# Patient Record
Sex: Female | Born: 1955 | Race: White | Hispanic: No | Marital: Married | Smoking: Former smoker
Health system: Southern US, Community
[De-identification: ages and names within clinical notes are randomized; demographics above are authoritative.]

## PROBLEM LIST (undated history)

## (undated) DIAGNOSIS — K219 Gastro-esophageal reflux disease without esophagitis: Secondary | ICD-10-CM

## (undated) DIAGNOSIS — G629 Polyneuropathy, unspecified: Secondary | ICD-10-CM

## (undated) DIAGNOSIS — Z9889 Other specified postprocedural states: Secondary | ICD-10-CM

## (undated) DIAGNOSIS — E119 Type 2 diabetes mellitus without complications: Secondary | ICD-10-CM

## (undated) DIAGNOSIS — M545 Low back pain, unspecified: Secondary | ICD-10-CM

## (undated) DIAGNOSIS — G4730 Sleep apnea, unspecified: Secondary | ICD-10-CM

## (undated) DIAGNOSIS — F419 Anxiety disorder, unspecified: Secondary | ICD-10-CM

## (undated) DIAGNOSIS — R112 Nausea with vomiting, unspecified: Secondary | ICD-10-CM

## (undated) DIAGNOSIS — E785 Hyperlipidemia, unspecified: Secondary | ICD-10-CM

## (undated) DIAGNOSIS — I10 Essential (primary) hypertension: Secondary | ICD-10-CM

## (undated) DIAGNOSIS — Z794 Long term (current) use of insulin: Secondary | ICD-10-CM

## (undated) DIAGNOSIS — E559 Vitamin D deficiency, unspecified: Secondary | ICD-10-CM

## (undated) DIAGNOSIS — G912 (Idiopathic) normal pressure hydrocephalus: Secondary | ICD-10-CM

## (undated) DIAGNOSIS — R000 Tachycardia, unspecified: Secondary | ICD-10-CM

## (undated) DIAGNOSIS — I2089 Other forms of angina pectoris: Secondary | ICD-10-CM

## (undated) DIAGNOSIS — M75101 Unspecified rotator cuff tear or rupture of right shoulder, not specified as traumatic: Secondary | ICD-10-CM

## (undated) DIAGNOSIS — B009 Herpesviral infection, unspecified: Secondary | ICD-10-CM

## (undated) DIAGNOSIS — R0602 Shortness of breath: Secondary | ICD-10-CM

## (undated) DIAGNOSIS — G919 Hydrocephalus, unspecified: Secondary | ICD-10-CM

## (undated) DIAGNOSIS — F32A Depression, unspecified: Secondary | ICD-10-CM

## (undated) DIAGNOSIS — M519 Unspecified thoracic, thoracolumbar and lumbosacral intervertebral disc disorder: Secondary | ICD-10-CM

## (undated) DIAGNOSIS — Z319 Encounter for procreative management, unspecified: Secondary | ICD-10-CM

## (undated) DIAGNOSIS — D519 Vitamin B12 deficiency anemia, unspecified: Secondary | ICD-10-CM

## (undated) DIAGNOSIS — F329 Major depressive disorder, single episode, unspecified: Secondary | ICD-10-CM

## (undated) DIAGNOSIS — R42 Dizziness and giddiness: Secondary | ICD-10-CM

## (undated) DIAGNOSIS — T753XXA Motion sickness, initial encounter: Secondary | ICD-10-CM

## (undated) DIAGNOSIS — G5793 Unspecified mononeuropathy of bilateral lower limbs: Secondary | ICD-10-CM

## (undated) HISTORY — PX: EYE SURGERY: SHX253

## (undated) HISTORY — PX: HERNIA REPAIR: SHX51

## (undated) HISTORY — PX: BREAST FIBROADENOMA SURGERY: SHX580

## (undated) HISTORY — PX: CHOLECYSTECTOMY: SHX55

## (undated) HISTORY — PX: TUBAL LIGATION: SHX77

## (undated) HISTORY — DX: Nausea with vomiting, unspecified: R11.2

## (undated) HISTORY — DX: Other specified postprocedural states: Z98.890

## (undated) HISTORY — DX: Type 2 diabetes mellitus without complications: E11.9

## (undated) HISTORY — DX: Low back pain, unspecified: M54.50

## (undated) HISTORY — DX: Polyneuropathy, unspecified: G62.9

## (undated) HISTORY — DX: Gastro-esophageal reflux disease without esophagitis: K21.9

---

## 1994-11-24 ENCOUNTER — Ambulatory Visit
Admit: 1994-11-24 | Disposition: A | Discharge status: Home / Self Care | Payer: Self-pay | Admitting: Obstetrics & Gynecology

## 1995-12-28 ENCOUNTER — Ambulatory Visit
Admit: 1995-12-28 | Disposition: A | Discharge status: Home / Self Care | Payer: Self-pay | Admitting: Obstetrics & Gynecology

## 1998-01-01 ENCOUNTER — Ambulatory Visit: Admit: 1998-01-01 | Disposition: A | Discharge status: Home / Self Care | Payer: Self-pay | Admitting: Family Medicine

## 1998-04-09 ENCOUNTER — Ambulatory Visit
Admit: 1998-04-09 | Disposition: A | Discharge status: Home / Self Care | Payer: Self-pay | Admitting: Obstetrics & Gynecology

## 1999-05-18 ENCOUNTER — Ambulatory Visit
Admit: 1999-05-18 | Disposition: A | Discharge status: Home / Self Care | Payer: Self-pay | Admitting: Obstetrics & Gynecology

## 1999-05-19 ENCOUNTER — Ambulatory Visit
Admit: 1999-05-19 | Disposition: A | Discharge status: Home / Self Care | Payer: Self-pay | Admitting: Obstetrics & Gynecology

## 1999-06-16 ENCOUNTER — Ambulatory Visit
Admit: 1999-06-16 | Disposition: A | Discharge status: Home / Self Care | Payer: Self-pay | Admitting: Obstetrics & Gynecology

## 1999-06-16 ENCOUNTER — Other Ambulatory Visit: Payer: Self-pay

## 2000-08-18 ENCOUNTER — Ambulatory Visit
Admit: 2000-08-18 | Disposition: A | Discharge status: Home / Self Care | Payer: Self-pay | Admitting: Obstetrics & Gynecology

## 2001-08-30 ENCOUNTER — Ambulatory Visit
Admission: RE | Admit: 2001-08-30 | Disposition: A | Discharge status: Home / Self Care | Payer: Self-pay | Source: Ambulatory Visit | Admitting: Obstetrics & Gynecology

## 2001-12-14 ENCOUNTER — Ambulatory Visit
Admission: AD | Admit: 2001-12-14 | Disposition: A | Discharge status: Home / Self Care | Payer: Self-pay | Source: Ambulatory Visit | Admitting: Family Medicine

## 2002-01-01 ENCOUNTER — Ambulatory Visit
Admission: AD | Admit: 2002-01-01 | Disposition: A | Discharge status: Home / Self Care | Payer: Self-pay | Source: Ambulatory Visit | Admitting: Family Medicine

## 2002-02-01 ENCOUNTER — Ambulatory Visit
Admission: AD | Admit: 2002-02-01 | Disposition: A | Discharge status: Home / Self Care | Payer: Self-pay | Source: Ambulatory Visit | Admitting: Family Medicine

## 2002-10-25 ENCOUNTER — Ambulatory Visit
Admission: AD | Admit: 2002-10-25 | Disposition: A | Discharge status: Home / Self Care | Payer: Self-pay | Source: Ambulatory Visit | Admitting: Obstetrics & Gynecology

## 2004-02-27 ENCOUNTER — Ambulatory Visit
Admission: AD | Admit: 2004-02-27 | Disposition: A | Discharge status: Home / Self Care | Payer: Self-pay | Source: Ambulatory Visit | Admitting: Obstetrics & Gynecology

## 2004-09-28 ENCOUNTER — Ambulatory Visit
Admission: AD | Admit: 2004-09-28 | Disposition: A | Discharge status: Home / Self Care | Payer: Self-pay | Source: Ambulatory Visit | Admitting: Family Medicine

## 2005-05-07 ENCOUNTER — Ambulatory Visit
Admission: RE | Admit: 2005-05-07 | Disposition: A | Discharge status: Home / Self Care | Payer: Self-pay | Source: Ambulatory Visit | Admitting: Obstetrics & Gynecology

## 2005-06-01 ENCOUNTER — Ambulatory Visit
Admission: RE | Admit: 2005-06-01 | Disposition: A | Discharge status: Home / Self Care | Payer: Self-pay | Source: Ambulatory Visit | Admitting: Obstetrics & Gynecology

## 2006-02-01 DIAGNOSIS — G919 Hydrocephalus, unspecified: Secondary | ICD-10-CM

## 2006-02-01 HISTORY — PX: BRAIN SURGERY: SHX531

## 2006-02-01 HISTORY — DX: Hydrocephalus, unspecified: G91.9

## 2006-02-01 HISTORY — PX: OTHER SURGICAL HISTORY: SHX169

## 2006-05-09 ENCOUNTER — Ambulatory Visit
Admission: RE | Admit: 2006-05-09 | Disposition: A | Discharge status: Home / Self Care | Payer: Self-pay | Source: Ambulatory Visit | Admitting: Obstetrics & Gynecology

## 2006-07-25 ENCOUNTER — Ambulatory Visit
Admission: RE | Admit: 2006-07-25 | Disposition: A | Discharge status: Home / Self Care | Payer: Self-pay | Source: Ambulatory Visit | Admitting: Family Medicine

## 2006-08-15 ENCOUNTER — Ambulatory Visit
Admission: RE | Admit: 2006-08-15 | Disposition: A | Discharge status: Home / Self Care | Payer: Self-pay | Source: Ambulatory Visit | Admitting: Neurological Surgery

## 2006-08-15 LAB — GLUCOSE CSF: CSF Glucose: 63 mg/dL

## 2006-08-15 LAB — CELL COUNT CSF TUBE #1
CSF RBC Count Tube #1: 8 /mm3 — ABNORMAL HIGH (ref 0–0)
CSF WBC Count Tube #1: 1 /mm3 (ref 0–10)

## 2006-08-15 LAB — PROTEIN, CSF: CSF Protein: 17 mg/dL (ref 15–45)

## 2006-08-25 ENCOUNTER — Ambulatory Visit
Admission: RE | Admit: 2006-08-25 | Disposition: A | Discharge status: Home / Self Care | Payer: Self-pay | Source: Ambulatory Visit | Admitting: Neurological Surgery

## 2006-08-26 ENCOUNTER — Ambulatory Visit
Admission: RE | Admit: 2006-08-26 | Disposition: A | Discharge status: Home / Self Care | Payer: Self-pay | Admitting: Neurological Surgery

## 2006-09-08 ENCOUNTER — Ambulatory Visit
Admission: AD | Admit: 2006-09-08 | Disposition: A | Discharge status: Home / Self Care | Payer: Self-pay | Source: Ambulatory Visit | Admitting: Neurological Surgery

## 2006-10-27 ENCOUNTER — Ambulatory Visit
Admission: AD | Admit: 2006-10-27 | Disposition: A | Discharge status: Home / Self Care | Payer: Self-pay | Source: Ambulatory Visit | Admitting: Neurological Surgery

## 2006-10-27 ENCOUNTER — Ambulatory Visit
Admission: RE | Admit: 2006-10-27 | Disposition: A | Discharge status: Home / Self Care | Payer: Self-pay | Source: Ambulatory Visit | Admitting: Neurological Surgery

## 2006-10-28 ENCOUNTER — Ambulatory Visit
Admission: RE | Admit: 2006-10-28 | Disposition: A | Discharge status: Home / Self Care | Payer: Self-pay | Source: Ambulatory Visit | Admitting: Neurological Surgery

## 2006-10-28 LAB — BASIC METABOLIC PANEL
BUN: 17 mg/dL (ref 8–20)
CO2: 29 mEq/L (ref 21–30)
Calcium: 9.8 mg/dL (ref 8.6–10.2)
Chloride: 104 mEq/L (ref 98–107)
Creatinine: 0.9 mg/dL (ref 0.6–1.5)
Glucose: 101 mg/dL — ABNORMAL HIGH (ref 70–100)
Potassium: 4.1 mEq/L (ref 3.6–5.0)
Sodium: 143 mEq/L (ref 136–146)

## 2006-10-28 LAB — CBC- CERNER
Hematocrit: 39.3 % (ref 37.0–47.0)
Hgb: 13.5 G/DL (ref 12.0–16.0)
MCH: 32.4 PG — ABNORMAL HIGH (ref 28.0–32.0)
MCHC: 34.3 G/DL (ref 32.0–36.0)
MCV: 94.5 FL (ref 80.0–100.0)
MPV: 8 FL (ref 7.4–10.4)
Platelets: 293 /mm3 (ref 140–400)
RBC: 4.16 /mm3 — ABNORMAL LOW (ref 4.20–5.40)
RDW: 13.4 % (ref 11.5–15.0)
WBC: 7 /mm3 (ref 3.5–10.8)

## 2006-10-28 LAB — GFR

## 2006-11-10 ENCOUNTER — Ambulatory Visit
Admission: RE | Admit: 2006-11-10 | Disposition: A | Discharge status: Home / Self Care | Payer: Self-pay | Source: Ambulatory Visit | Admitting: Neurological Surgery

## 2007-01-16 ENCOUNTER — Ambulatory Visit
Admission: RE | Admit: 2007-01-16 | Disposition: A | Discharge status: Home / Self Care | Payer: Self-pay | Source: Ambulatory Visit | Admitting: Neurological Surgery

## 2007-06-08 ENCOUNTER — Ambulatory Visit
Admission: RE | Admit: 2007-06-08 | Disposition: A | Discharge status: Home / Self Care | Payer: Self-pay | Source: Ambulatory Visit | Admitting: Obstetrics & Gynecology

## 2007-06-09 ENCOUNTER — Ambulatory Visit
Admission: RE | Admit: 2007-06-09 | Disposition: A | Discharge status: Home / Self Care | Payer: Self-pay | Source: Ambulatory Visit | Admitting: Obstetrics & Gynecology

## 2007-07-19 ENCOUNTER — Ambulatory Visit
Admission: RE | Admit: 2007-07-19 | Disposition: A | Discharge status: Home / Self Care | Payer: Self-pay | Source: Ambulatory Visit | Admitting: Neurological Surgery

## 2007-11-17 ENCOUNTER — Ambulatory Visit
Admission: RE | Admit: 2007-11-17 | Disposition: A | Discharge status: Home / Self Care | Payer: Self-pay | Source: Ambulatory Visit | Admitting: Family Medicine

## 2008-08-21 ENCOUNTER — Ambulatory Visit
Admission: RE | Admit: 2008-08-21 | Disposition: A | Discharge status: Home / Self Care | Payer: Self-pay | Source: Ambulatory Visit | Admitting: Obstetrics & Gynecology

## 2009-09-15 ENCOUNTER — Ambulatory Visit
Admission: RE | Admit: 2009-09-15 | Disposition: A | Discharge status: Home / Self Care | Payer: Self-pay | Source: Ambulatory Visit | Admitting: Obstetrics & Gynecology

## 2009-09-29 ENCOUNTER — Ambulatory Visit
Admission: RE | Admit: 2009-09-29 | Disposition: A | Discharge status: Home / Self Care | Payer: Self-pay | Source: Ambulatory Visit | Admitting: Obstetrics & Gynecology

## 2010-01-23 ENCOUNTER — Ambulatory Visit
Admission: RE | Admit: 2010-01-23 | Disposition: A | Discharge status: Home / Self Care | Payer: Self-pay | Source: Ambulatory Visit | Admitting: Neurology

## 2010-06-11 ENCOUNTER — Ambulatory Visit
Admission: RE | Admit: 2010-06-11 | Disposition: A | Discharge status: Home / Self Care | Payer: Self-pay | Source: Ambulatory Visit | Attending: Neurology | Admitting: Neurology

## 2010-08-10 ENCOUNTER — Ambulatory Visit
Admission: RE | Admit: 2010-08-10 | Discharge: 2010-08-10 | Disposition: A | Discharge status: Home / Self Care | Payer: Self-pay | Source: Ambulatory Visit | Attending: Neurology | Admitting: Neurology

## 2010-08-15 ENCOUNTER — Ambulatory Visit
Admission: RE | Admit: 2010-08-15 | Disposition: A | Discharge status: Home / Self Care | Payer: Self-pay | Source: Ambulatory Visit | Attending: Neurology | Admitting: Neurology

## 2010-09-15 ENCOUNTER — Ambulatory Visit
Admission: RE | Admit: 2010-09-15 | Disposition: A | Discharge status: Home / Self Care | Payer: Self-pay | Source: Ambulatory Visit | Attending: Obstetrics & Gynecology | Admitting: Obstetrics & Gynecology

## 2010-09-22 ENCOUNTER — Ambulatory Visit
Admission: RE | Admit: 2010-09-22 | Disposition: A | Discharge status: Home / Self Care | Payer: Self-pay | Source: Ambulatory Visit | Attending: Neurology | Admitting: Neurology

## 2010-10-03 ENCOUNTER — Ambulatory Visit
Admission: RE | Admit: 2010-10-03 | Disposition: A | Discharge status: Home / Self Care | Payer: Self-pay | Source: Ambulatory Visit | Attending: Neurology | Admitting: Neurology

## 2010-11-02 ENCOUNTER — Ambulatory Visit
Admission: RE | Admit: 2010-11-02 | Disposition: A | Discharge status: Home / Self Care | Payer: Self-pay | Source: Ambulatory Visit | Attending: Neurology | Admitting: Neurology

## 2010-11-04 ENCOUNTER — Ambulatory Visit
Admission: RE | Admit: 2010-11-04 | Disposition: A | Discharge status: Home / Self Care | Payer: Self-pay | Source: Ambulatory Visit | Attending: Family Medicine | Admitting: Family Medicine

## 2010-11-11 LAB — ECG 12-LEAD
Atrial Rate: 60 {beats}/min
P Axis: 29 degrees
P-R Interval: 198 ms
Q-T Interval: 424 ms
QRS Duration: 98 ms
QTC Calculation (Bezet): 424 ms
R Axis: -14 degrees
T Axis: -7 degrees
Ventricular Rate: 60 {beats}/min

## 2010-11-19 NOTE — Consults (Signed)
DATE OF BIRTH:                        1955/02/13      ADMISSION DATE:                     10/28/2006            PATIENT LOCATION:                    [id number]            DATE OF CONSULTATION:                10/28/2006      CONSULTANT:                        Lendell Caprice, MD      CONSULTING SERVICE:                  NEUROSURGERY            Ms. Olivarez is about two months out from a revision of a distal VP shunt.      She called me yesterday to inform me that over the past two days she has      noticed slight firmness over the abdominal incision site and she presented      to the office.  She had no guarding or abdominal tenderness and no      drainage, but indeed there was some firmness deep to the incision which was      otherwise well healed.  I sent her for an abdominal x-ray which revealed      coiling of the abdomen and I had her return back for a CT scan which has      revealed the catheter to be dislodged deep to the subcutaneous region.  We      discussed the option of another shunt revision without any guarantees that      this would not occur again vs removal of the shunt in total but she states      that the shunt has helped her a lot, especially with regards to her balance      so she wants to go ahead with a revision again.  The last operation was      assisted with General Surgery- Dr. Estil Daft, therefore I contacted him again      and he will assist with the revision of the distal shunt catheter again and      his plan is to proceed with the placement in another location altogether      and this was reviewed with the patient who was in agreement.  Today I      rediscussed the risks, benefits and alternatives of surgery in detail with      her and her husband again.  I emphasized that there would be no guarantees      that she would not have another dislodgement as even this one is unusual      that she is about two months out from her last revision.  She is a rather      obese female and she is aware of  these potential issues and has requested      to proceed.  We discussed the risks of the surgery including but not      limited to infection, bleeding, bowel perforation, worsening or  no change      in symptoms, shunt failure, coma and death. Her questions were answered and      I discussed these again in the preop area with her and her husband, and      they have requested to proceed with surgery.  I will assist Dr. Estil Daft with      the operation as needed.  No guarantees with regards to her outcome was      given or implied.                        Electronic Signing MD: Lendell Caprice, MD  ([id number])            D: 10/28/2006 by Lendell Caprice, MD      T: 10/28/2006 by Dot Been ([id number]) (Dorris Carnes: [id number])      cc:  Lendell Caprice, MD          Particia Lather, MD

## 2010-11-19 NOTE — Op Note (Signed)
DATE OF BIRTH:                        Oct 02, 1955      ADMISSION DATE:                     08/26/2006            PATIENT LOCATION:                     [id number]            DATE OF PROCEDURE:                   08/26/2006      SURGEON:                            Lendell Caprice, MD      ASSISTANT(S):                  COSURGEON:  Particia Lather, M.D.            PREOPERATIVE DIAGNOSIS:  DISTAL RIGHT VENTRICULOPERITONEAL SHUNT FAILURE.            POSTOPERATIVE DIAGNOSIS:  DISTAL RIGHT VENTRICULOPERITONEAL SHUNT FAILURE.            PROCEDURE:  REVISION OF DISTAL VENTRICULOPERITONEAL SHUNT.            ANESTHESIA:  Local with IV sedation.            INDICATIONS FOR SURGERY:  This is a patient with hydrocephalus who is      rather obese. She underwent a recent VP shunt.  About 7 days after the      surgery, she noticed some swelling over her incision, and she underwent a      CT scan, revealing the catheter to be dislodged in the subcutaneous region.      Given this finding, we discussed the risks, benefits, and alternatives to      the above-mentioned operation, and informed consent was obtained.  I did      inform her that there would be no guarantees that she would not have      another bout of catheter dislodgement given her obesity and that should      this occur we might have to revise the catheter again.  Her questions were      answered, and she is in agreement with the plan.  She was also informed      that this would be a revision performed with me and Dr. Estil Daft in general      surgery.            DESCRIPTION OF PROCEDURE:  After obtaining informed consent, the patient      was brought to the operating room, where she was induced with IV sedation,      and the abdomen was prepped and draped in the usual sterile fashion.  All      pressure points were meticulously padded.  Local anesthetics were      infiltrated, and the old abdominal incision was reopened on the right side.      There was a large CSF fluid collection  which was irrigated and suctioned      out.            At this point, the peritoneal catheter was identified, and Dr. Estil Daft and  I      reidentified the fascial closure and the peritoneal opening and reopened      the peritoneum.  The bowel and omentum were directly visualized.  We      elected to extend the length of the catheter to yet a longer piece given      her fairly large body habitus.  Therefore, a straight connector was used to      connect the 2 ends of the peritoneal catheter, which was secured with 2-0      silk ties at each end, and the Bactiseal peritoneal catheter was extended      and placed into the intraperitoneal cavity under direct visualization.  The      peritoneum was reapproximated with multiple 2-0 Vicryls, and the fascial      layer was then closed with multiple 0 Vicryls after copious irrigation with      bacitracin solution.            The wound and the placement of the catheter were performed by Dr. Estil Daft      and myself, and the subcutaneous layer was then reapproximated additionally      with Vicryl sutures and the skin was stapled.  A sterile dressing was      applied.  The patient was awakened and taken to the recovery room in stable      condition.  All needle and sponge counts were deemed correct by the      circulating nursing staff.            ESTIMATED BLOOD LOSS:   Less than 25 mL.            The patient did not require any intraoperative blood transfusion.                                          Electronic Signing MD: Lendell Caprice, MD  ([id number])            D: 08/26/2006 by Lendell Caprice, MD      T: 08/26/2006 by [id number] ([id number]) ([id number])      cc:  Lendell Caprice, MD          Particia Lather, MD

## 2010-11-19 NOTE — Op Note (Signed)
DATE OF BIRTH:                        January 25, 1956      ADMISSION DATE:                     08/26/2006            PATIENT LOCATION:                     [id number]            DATE OF PROCEDURE:                   08/26/2006      SURGEON:                            Particia Lather, MD      ASSISTANT(S):                  COSURGEON:  Lendell Caprice, M.D.            PREOPERATIVE DIAGNOSIS:  DISTAL RIGHT VENTRICULAR SHUNT FAILURE.            POSTOPERATIVE DIAGNOSIS:  DISTAL RIGHT VENTRICULAR SHUNT FAILURE.            PROCEDURE:  REVISION OF DISTAL VENTRICULOPERITONEAL SHUNT.            ANESTHESIA:  Local with intravenous sedation.            INDICATIONS:  The patient recently underwent a right ventriculoperitoneal      shunt placement for hydrocephalus by Dr. Virgel Manifold on 08/15/2006.  Dr.      Virgel Manifold had tunneled the catheter subsequently in the right anterior chest      and across the right subcostal margin to a periumbilical incision.  Seven      days after the procedure the patient noticed a swelling under her      periumbilical incision.  A CT scan of the abdomen showed that the catheter      was dislodged in the subcutaneous region.  The patient is being brought      back to the operating room for revision of the distal portion of the      catheter.            DESCRIPTION OF PROCEDURE:  The chest and abdomen were prepped and draped in      usual sterile fashion.  We started by reopening the periumbilical incision      and the cerebrospinal fluid was drained; there were no signs of infection.      The catheter was identified subcutaneously.  The fascial closure was      reopened.  The catheter was retrieved.  In order to avoid a subsequent      dislodgement of the catheter, we decided to extend the length of the      catheter by using a metal connector with an additional catheter length.      The connector was secured with 2 silk sutures.  The extended catheter was      then reinserted back into the peritoneal cavity and  directed towards the      right lower quadrant.  The fascia was closed with interrupted 0 Vicryl      sutures. An additional suture was used to anchor the metal connector to the  fascia to avoid dislodgement.  the subcutaneous tissue was approximated      with multiple interrupted sutures of 2-0 Vicryl and the skin was closed      with staples.  The patient tolerated the procedure well.  The patient left      the operating room to the recovery room in a satisfactory condition.                                          Electronic Signing MD: Particia Lather, MD  ([id number])            D: 10/28/2006 by Particia Lather, MD      T: 10/28/2006 by [id number] ([id number]) ([id number])      cc:  Lendell Caprice, MD          Particia Lather, MD

## 2010-11-19 NOTE — Op Note (Signed)
DATE OF BIRTH:                        September 29, 1955      ADMISSION DATE:                     08/15/2006            PATIENT LOCATION:                    [id number]            DATE OF PROCEDURE:                   08/15/2006      SURGEON:                            Lendell Caprice, MD      ASSISTANT(S):                         Bernadene Bell Hegens, SA-C                  PREOPERATIVE DIAGNOSIS:  HYDROCEPHALUS.            POSTOPERATIVE DIAGNOSIS:  HYDROCEPHALUS.            PROCEDURES      1.   PLACEMENT OF RIGHT VENTRICULOPERITONEAL SHUNT USING MEDTRONIC DELTA      1.5 MEDIUM PRESSURE VALVE.      2.   NEUROPEN-ASSISTED ENDOSCOPIC PLACEMENT OF VENTRICULAR CATHETER.            ANESTHESIA:  General endotracheal.            INDICATION FOR SURGERY:  This is a 55 year old patient with      ventriculomegaly and difficulties with gait and headaches.  Given her      clinical and radiographic findings, we discussed the risks, benefits and      alternatives to the above mentioned operation including, but not limited to      infection, bleeding, neurological injury, bowel perforation, shunt failure      requiring revision, worsening or no change in symptoms, coma and death.      After consideration and discussion with her husband, she has requested to      proceed with the surgery.  No guarantees with regards to her outcome were      given or implied.            DESCRIPTION OF PROCEDURE:  After obtaining informed consent, the patient      was brought to the operating room, where after successful induction and      intubation by anesthesia, IV antibiotics were administered.  The lower      extremities were placed in sequentials and all pressure points were      meticulously padded.  The head on the right side was shaved and the head,      neck, chest and abdomen were prepped and draped in the usual sterile      fashion.  Local anesthetics were infiltrated and a right frontal incision      was made as well as a right periumbilical incision.  A  BactiSeal Codman      catheter was tunneled and an intervening stab incision was made in the      right cervical area.  The Delta 1.5 medium pressure  valve was then primed      and it was ensured to be working properly and there was good distal flow      and secured to the BactiSeal catheter with a 2-0 silk tie.  The apparatus      was then tunneled.  One bur hole was then placed in the right frontal area      and using the Neuropen neural endoscopic device, the right ventricular      catheter was cannulated without any difficulty and the foramen of Monroe      was identified.  The CSF was noted to be under moderate pressure and it was      sent for routine pathological examination and the catheter was placed in      proximity to the foramen of Monroe and secured to the proximal portion of      the valve with a 2-0 silk tie and the valve was tunneled deep to the      incision.  All wounds were then irrigated with copious bacitracin solution      and there was good distal flow of CSF as noted through the shunt by pumping      the valve itself.  At this time the peritoneum was identified and      intraperitoneal contents were directly visualized by performing Valsalva      maneuver and the catheter was placed into the intraperitoneal cavity      without any difficulty and the peritoneum and the overlying fascial layers      were then reapproximated with multiple 2-0 Vicryls and all incisions were      irrigated with copious bacitracin solution.  Hemostasis was achieved and      the incisions were reapproximated in multiple anatomical layers and stapled      along the skin.  Sterile dressings were applied.            The patient was awakened, extubated and taken to the recovery room in      stable condition.  All needle and sponge counts were deemed correct by the      circulating nursing staff.  Estimated blood loss was less than 25-mL.  The      patient did not require any intraoperative blood transfusion.                                     ___________________________________          Date Signed: __________      Lendell Caprice, MD  ([id number])            D: 08/15/2006 by Lendell Caprice, MD      T: 08/15/2006 by [id number] ([id number]) ([id number])      cc:  Lendell Caprice, MD            Dr. Burnell Blanks, [address], [place], [place] [address]

## 2010-11-19 NOTE — Op Note (Signed)
DATE OF BIRTH:                        [birth year]      ADMISSION DATE:                     10/28/2006            PATIENT LOCATION:                    [facility] 10/28/2006            DATE OF PROCEDURE:                   10/28/2006      SURGEON:                            Particia Lather, MD      ASSISTANT(S):                         Lendell Caprice, MD                  PREOPERATIVE DIAGNOSIS:  DISTAL VENTRICULOPERITONEAL SHUNT FAILURE.            POSTOPERATIVE DIAGNOSIS:  DISTAL VENTRICULOPERITONEAL SHUNT FAILURE.            PROCEDURE:  REVISION OF DISTAL VENTRICULOPERITONEAL SHUNT.            ANESTHESIA:      1.   General with LMA.      2.   Local with 1% Xylocaine.            INDICATIONS:  The patient underwent a right ventriculoperitoneal shunt for      hydrocephalus on 08/15/2006.  The shunt was inserted intraperitoneally      through a midline periumbilical incision.  On 08/26/2006, the distal part      of the shunt was dislodged into the subcutaneous region.  I worked with Dr.      Virgel Manifold on revising the distal part of the shunt with reinsertion of the      catheter through the same periumbilical incision.  The patient did well      until yesterday when she was found to have another dislodgement of the      catheter to the subcutaneous region with a subcutaneous CSF cyst.  The      patient returned to the hospital for distal shunt revision.  I recommended      insertion of the shunt through a subxiphoid incision.            DESCRIPTION OF PROCEDURE:  The fluoroscopy was used to identify the course      of the catheter in the abdomen.  External marks were made on the skin to      mark that course. The catheter was identified in the right upper quadrant      in the mid clavicular line at the level of the right subcostal margin.  The      chest and abdomen were prepped and draped in the usual sterile fashion.  A      horizontal incision was made under the right subcostal margin centered over      the skin mark that was  made preoperatively.  The catheter was easily      identified in the subcutaneous tissue.  An attempt  at retrieving the entire      catheter through the right subcostal incision was unsuccessful, as it      appeared that the catheter was adherent to the periumbilical incision.  For      that reason, the periumbilical wound was reopened.  The subcutaneous CSF      cyst was entered.  The catheter was identified.  The metal connector that      was used in the previous operation was identified.  The catheter was      transected proximal to that connector.  The catheter was then retrieved      through the right subcostal incision.  The distal part of the catheter was      pulled out of the peritoneum easily and discarded.  A new catheter was then      brought into the field and connected to the proximal part of the old      catheter.  A vertical midline subxiphoid incision was made.  The linea alba      was identified and incised.  The extraperitoneal fat was dissected, and the      peritoneum was identified.  The peritoneum was entered, and the left lobe      of the liver was identified.  The catheter was tunneled between the      subcostal and the subxiphoid incisions.  Using a bayonet forceps, the      catheter was inserted intraperitoneally over the left lobe of the liver.      The linea alba was closed with multiple interrupted sutures of 0 PDS.  The      subcutaneous tissue was closed with subcutaneous sutures of 2-0 Vicryl, and      the skin was closed with staples.  The subcostal incision and periumbilical      incision were also closed in a similar fashion.  The patient tolerated the      procedure well.  The estimated blood loss was minimal.  The patient was      extubated in the operating room and taken to the recovery room in      satisfactory condition.                                          Electronic Signing MD: Particia Lather, MD  ([id number])            D: 10/28/2006 by Particia Lather, MD      T: 10/28/2006 by  [id number] ([id number]) ([id number])      cc:  Particia Lather, MD

## 2010-11-19 NOTE — Discharge Summary (Signed)
DATE OF BIRTH:                        May 31, 1955            ADMISSION DATE:                     10/28/2006      DISCHARGE DATE:                     10/28/2006            ATTENDING PHYSICIAN:                  Lendell Caprice, MD            HISTORY OF PRESENT ILLNESS:  This is a patient who underwent a revision of      distal VP shunt by Dr. Estil Daft and myself.  Postoperatively she was      discharged home after recovering from anesthesia.  Supportive measures and      precautions were reviewed with the patient and her husband.            DISCHARGE FOLLOWUP:  She is to follow up with me in a couple of weeks.            DISCHARGE MEDICATIONS:  She was discharged home with analgesics and a      course of oral antibiotics.                                          Electronic Signing MD: Lendell Caprice, MD  ([id number])            D: 10/29/2006 by Lendell Caprice, MD      T: 10/30/2006 by [id number] ([id number]) Dorris Carnes: [id number])      cc:  Lendell Caprice, MD

## 2010-11-19 NOTE — Discharge Summary (Signed)
DATE OF BIRTH:                        [birth year]            ADMISSION DATE:                     08/15/2006      DISCHARGE DATE:                     08/16/2006            ATTENDING PHYSICIAN:                  Lendell Caprice, MD            HISTORY OF PRESENT ILLNESS:  This is a patient with hydrocephalus who      underwent a right VP shunt placement.  Postoperatively she received      antibiotics.  She received postoperative analgesics and was up ambulating,      tolerating p.o. well.  Followup routine head CT scan was performed.  No      intraventricular hemorrhage or significant intracerebral hemorrhage noted.      Clinically she was doing well on postoperative day number 1, and prior to      discharge, her incisions were inspected; they were noted to be healing      well.  She was discharged home on postoperative day number 1.            Supportive measures and precautions were reviewed with her.  She will      follow up with me in a couple of weeks for evaluation of her incisions.      She was discharged home on Vicodin for analgesic use, and she is to      continue her preoperative medications otherwise.                                    Electronic Signing MD: Lendell Caprice, MD  ([id number])            D: 08/16/2006 by Lendell Caprice, MD      T: 08/16/2006 by [id number] ([id number]) (Dorris Carnes: [id number])      cc:  Lendell Caprice, MD            Dr. Burnell Blanks

## 2010-12-03 ENCOUNTER — Ambulatory Visit
Admission: RE | Admit: 2010-12-03 | Disposition: A | Discharge status: Home / Self Care | Payer: Self-pay | Source: Ambulatory Visit | Attending: Neurology | Admitting: Neurology

## 2011-04-20 ENCOUNTER — Ambulatory Visit
Admission: RE | Admit: 2011-04-20 | Disposition: A | Discharge status: Home / Self Care | Payer: Self-pay | Source: Ambulatory Visit | Attending: Family Medicine | Admitting: Family Medicine

## 2011-10-06 ENCOUNTER — Other Ambulatory Visit: Payer: Self-pay | Admitting: Obstetrics & Gynecology

## 2011-10-06 DIAGNOSIS — Z1231 Encounter for screening mammogram for malignant neoplasm of breast: Secondary | ICD-10-CM

## 2011-10-07 ENCOUNTER — Ambulatory Visit
Admission: RE | Admit: 2011-10-07 | Discharge: 2011-10-07 | Disposition: A | Discharge status: Home / Self Care | Payer: BC Managed Care – PPO | Source: Ambulatory Visit | Attending: Obstetrics & Gynecology | Admitting: Obstetrics & Gynecology

## 2011-10-07 DIAGNOSIS — Z1231 Encounter for screening mammogram for malignant neoplasm of breast: Secondary | ICD-10-CM

## 2012-01-21 ENCOUNTER — Emergency Department: Payer: BC Managed Care – PPO

## 2012-01-21 ENCOUNTER — Emergency Department
Admission: EM | Admit: 2012-01-21 | Discharge: 2012-01-21 | Disposition: A | Discharge status: Home / Self Care | Payer: BC Managed Care – PPO | Attending: Emergency Medicine | Admitting: Emergency Medicine

## 2012-01-21 DIAGNOSIS — W010XXA Fall on same level from slipping, tripping and stumbling without subsequent striking against object, initial encounter: Secondary | ICD-10-CM | POA: Insufficient documentation

## 2012-01-21 DIAGNOSIS — E785 Hyperlipidemia, unspecified: Secondary | ICD-10-CM | POA: Insufficient documentation

## 2012-01-21 DIAGNOSIS — S43006A Unspecified dislocation of unspecified shoulder joint, initial encounter: Secondary | ICD-10-CM | POA: Insufficient documentation

## 2012-01-21 DIAGNOSIS — I10 Essential (primary) hypertension: Secondary | ICD-10-CM | POA: Insufficient documentation

## 2012-01-21 DIAGNOSIS — E119 Type 2 diabetes mellitus without complications: Secondary | ICD-10-CM | POA: Insufficient documentation

## 2012-01-21 DIAGNOSIS — G4730 Sleep apnea, unspecified: Secondary | ICD-10-CM | POA: Insufficient documentation

## 2012-01-21 DIAGNOSIS — F411 Generalized anxiety disorder: Secondary | ICD-10-CM | POA: Insufficient documentation

## 2012-01-21 HISTORY — DX: Essential (primary) hypertension: I10

## 2012-01-21 HISTORY — DX: Anxiety disorder, unspecified: F41.9

## 2012-01-21 HISTORY — DX: Sleep apnea, unspecified: G47.30

## 2012-01-21 HISTORY — DX: Hyperlipidemia, unspecified: E78.5

## 2012-01-21 HISTORY — DX: Type 2 diabetes mellitus without complications: E11.9

## 2012-01-21 MED ORDER — PROPOFOL 10 MG/ML IV EMUL
100.00 mg | Freq: Once | INTRAVENOUS | Status: AC
Start: 2012-01-21 — End: 2012-01-21
  Administered 2012-01-21: 50 mg via INTRAVENOUS
  Filled 2012-01-21: qty 1

## 2012-01-21 MED ORDER — SODIUM CHLORIDE 0.9 % IV BOLUS
1000.00 mL | Freq: Once | INTRAVENOUS | Status: AC
Start: 2012-01-21 — End: 2012-01-21
  Administered 2012-01-21: 1000 mL via INTRAVENOUS

## 2012-01-21 MED ORDER — HYDROCODONE-ACETAMINOPHEN 5-300 MG PO TABS
1.00 | ORAL_TABLET | Freq: Four times a day (QID) | ORAL | Status: DC | PRN
Start: 2012-01-21 — End: 2012-10-04

## 2012-01-21 MED ORDER — IBUPROFEN 600 MG PO TABS
600.00 mg | ORAL_TABLET | Freq: Four times a day (QID) | ORAL | Status: AC | PRN
Start: 2012-01-21 — End: 2012-01-31

## 2012-01-21 NOTE — ED Provider Notes (Signed)
Physician/Midlevel provider first contact with patient: 01/21/12 1804         History     Chief Complaint   Patient presents with   . Shoulder Injury     HPI Comments: MICKENZIE STOLAR is a 56 y.o. female slipped and fell ~ 4 pm onto R shoulder and c/o pain to shoulder radiating down arm w/ no other pain or injury.  Seen at urgent care and XR done with ? Dislocation so sent to ED for further eval/care.      Patient is a 56 y.o. female presenting with shoulder injury. The history is provided by the patient and the spouse.   Shoulder Injury  This is a new problem. The current episode started today. The problem occurs constantly. The problem has been unchanged.       Past Medical History   Diagnosis Date   . Hypertensive disorder    . Hyperlipidemia    . Diabetes mellitus without complication    . Hydrocephalus    . Anxiety    . Sleep apnea        Past Surgical History   Procedure Date   . Cecarean        Family History   Problem Relation Age of Onset   . Breast cancer Neg Hx        Social  History   Substance Use Topics   . Smoking status: Never Smoker    . Smokeless tobacco: Not on file   . Alcohol Use: No       .     No Known Allergies    Current/Home Medications    ACYCLOVIR (ZOVIRAX) 400 MG TABLET        HYDROCHLOROTHIAZIDE (HYDRODIURIL) 25 MG TABLET        JANUMET 50-500 MG PER TABLET    daily.     LOVAZA 1 G CAPSULE        LYRICA 100 MG CAPSULE        METOPROLOL XL (TOPROL-XL) 50 MG 24 HR TABLET        PANTOPRAZOLE (PROTONIX) 40 MG TABLET        PAROXETINE (PAXIL) 10 MG TABLET        QUINAPRIL (ACCUPRIL) 40 MG TABLET    20 mg.     WELCHOL 3.75 G PACK            Review of Systems   All other systems reviewed and are negative.        Physical Exam    BP 162/72  Pulse 50  Temp 98.3 F (36.8 C)  Resp 16  Ht 1.549 m  Wt 77.111 kg  BMI 32.14 kg/m2  SpO2 100%    Physical Exam   Nursing note and vitals reviewed.  Constitutional: She is oriented to person, place, and time. She appears well-developed and  well-nourished.   HENT:   Head: Normocephalic and atraumatic.   Eyes: Conjunctivae normal and EOM are normal.   Neck: Normal range of motion. Neck supple.   Cardiovascular: Normal rate and regular rhythm.    Pulmonary/Chest: Effort normal and breath sounds normal.   Abdominal: Soft. There is no tenderness.   Musculoskeletal:        R shoulder w/ deformity and TTP w/ ROM limited by pain.  No numbness (axillary nerve sensation intact) with distal radial pulse and motor intact.     Neurological: She is alert and oriented to person, place, and time.   Skin: Skin is  warm and dry.       MDM and ED Course     ED Medication Orders      Start     Status Ordering Provider    01/21/12 1945   sodium chloride 0.9 % bolus 1,000 mL   Once      Route: Intravenous  Ordered Dose: 1,000 mL         Last MAR action:  Stopped Elianny Buxbaum C    01/21/12 1945   propofol (DIPRIVAN) injection 100 mg   Once      Route: Intravenous  Ordered Dose: 100 mg         Last MAR action:  Given Shanise Balch C                 MDM  Nurses notes including past history, allergies, vital signs reviewed.  Past Medical History   Diagnosis Date   . Hypertensive disorder    . Hyperlipidemia    . Diabetes mellitus without complication    . Hydrocephalus    . Anxiety    . Sleep apnea      Past Surgical History   Procedure Date   . Cecarean      No Known Allergies    BP 162/72  Pulse 50  Temp 98.3 F (36.8 C)  Resp 16  Ht 1.549 m  Wt 77.111 kg  BMI 32.14 kg/m2  SpO2 100%    O2 Sat in ED is 100% which is normal.  Pt is being observed on intermittent pulsox    Fall with shoulder injury.  Outside radiology disc reviewed with 2 view XR R shoulder showing anterior/inferior dislocation without fracture.  Plan to reduce.    Patient LILLYANN AHART was seen and treated by me in the ED  Forest Gleason, MD  6:55 PM          Orthopedic Injury  Date/Time: 01/21/2012 7:53 PM  Performed by: Forest Gleason  Authorized by: Forest Gleason  Consent: Verbal consent  obtained.  Risks and benefits: risks, benefits and alternatives were discussed  Consent given by: patient and spouse  Patient understanding: patient states understanding of the procedure being performed  Patient consent: the patient's understanding of the procedure matches consent given  Procedure consent: procedure consent matches procedure scheduled  Relevant documents: relevant documents present and verified  Test results: test results available and properly labeled  Site marked: the operative site was marked  Imaging studies: imaging studies available  Patient identity confirmed: verbally with patient and provided demographic data  Time out: Immediately prior to procedure a "time out" was called to verify the correct patient, procedure, equipment, support staff and site/side marked as required.  Injury location: shoulder  Location details: right shoulder  Injury type: dislocation  Pre-procedure neurovascular assessment: neurovascularly intact  Patient sedated: yes  Manipulation performed: yes  Reduction method: external rotation  Reduction successful: yes  Immobilization: sling  Post-procedure neurovascular assessment: post-procedure neurovascularly intact  Patient tolerance: Patient tolerated the procedure well with no immediate complications.      SEDATION ASSESSMENT AND REPORT      Date Time: 01/24/2012 10:37 AM  Patient Name: LAQUIDA, COTRELL [id number]     Procedure: Joint Dislocation Reduction: shoulder    Written consent was obtained and signed originals were given to the nursing staff for scanning into the record.  No    History       Past Medical History:  Past Medical History  Diagnosis Date   . Hypertensive disorder    . Hyperlipidemia    . Diabetes mellitus without complication    . Hydrocephalus    . Anxiety    . Sleep apnea        Past Surgical History:  Past Surgical History   Procedure Date   . Cecarean          History of anesthesia complications:  none    Allergies: No Known  Allergies      Difficult airway risk factors     History of sleep apnea No  Obesity Yes  Short or thick neck Yes  Restricted tracheal lumen No  Limited cervical extension No  History of prior difficult intubation No  Other:  V-P shunt in place    NPO: Since 2 pm      Pre sedation Physical Exam   Immediately prior to sedation the patient was reassessed.  Blood pressure 162/72, pulse 50, temperature 98.3 F (36.8 C), resp. rate 16, height 1.549 m, weight 77.111 kg, SpO2 100.00%.    Please see history and physical from ED note on this date of service for complete details of initial presentation.    My immediate pre-sedation assessment includes the following changes from my initial assessment:  None          Mallampati Score: Class 3: Soft and hard palate and base of the uvula are visible    ASA Class:Class 2 - Mild systemic disease, no functional limitations    Deep sedation using moderate sedation guidelines single provider caveat:  In this case, there was only one Medical Staff Member available to perform the procedure and the sedation.  After careful consideration, the benefit of proceeding with one provider outweighs the risk of delaying the procedure while awaiting availability of a second provider.  Yes    Immediately prior to the procedure the necessary equipment for sedation was verified to be present, a time out was called and the patients name and planned procedure were reviewed at the bedside.    Plan       Plan: Propofol      Post Procedure Recovery     Complications: none     I was continuously present at the bedside for 16-38 minutes following the administration of the first dose of sedation medication.    Patient was reassessed following the procedure and reached their pre-procedure baseline.  Yes       Forest Gleason, MD      Radiology Results (24 Hour)     Procedure Component Value Units Date/Time    Shoulder Right 2+ Views [[id number]] Collected:01/21/12 2010    Order Status:Completed   Updated:01/21/12 2018    Narrative:    History: Postreduction     AP and scapular Y views of the right shoulder demonstrate no evidence of  fracture, dislocation, or significant bone lesion.  Joint spaces are  normal.  No soft tissue calcification is evident.       Impression:     Normal study.                         Clinical Impression & Disposition     Clinical Impression  Final diagnoses:   Shoulder dislocation        ED Disposition     Discharge Feven A Brand[facility] discharge to home/self care.    Condition at discharge: Stable  New Prescriptions    HYDROCODONE-ACETAMINOPHEN (VICODIN) 5-300 MG TABS    Take 1-2 tablets by mouth every 6 (six) hours as needed.    IBUPROFEN (ADVIL,MOTRIN) 600 MG TABLET    Take 1 tablet (600 mg total) by mouth every 6 (six) hours as needed for Pain or Fever.               Forest Gleason, MD  01/24/12 1037

## 2012-01-21 NOTE — Discharge Instructions (Signed)
Dislocation, Shoulder    You have been seen for a dislocation of your shoulder.    The dislocation has been reduced (repaired) so that the bones are now in their normal position.    A dislocation occurs when one of the 2 bones which make up a joint gets out of place so it is no longer in the proper alignment to allow the joint to bend. Sometimes a ligament or set of ligaments becomes injured during the dislocation. Shoulder dislocations can also cause an injury to one of the nerves which supplies the arm and hand with sensation. A fracture (broken bone) may also be associated with a dislocation.    The general care of a dislocation after relocation of the bones, is the use of a medication to reduce pain, the use of a sling to reduce movement, and Resting, Icing, Compressing and Elevating the injured area. Remember this as "RICE."   REST: Limit the use of the injured body part.   ICE: By applying ice to the affected area, swelling and pain can be reduced. Place some ice cubes in a re-sealable (Ziploc) bag and add some water. Put a thin washcloth between the bag and the skin. Apply the ice bag to the area for at least 20 minutes. Do this at least 4 times per day. Using the ice for longer times and more frequently is OK. NEVER APPLY ICE DIRECTLY TO THE SKIN.   COMPRESS: Compression means to apply pressure around the injured area such as with a splint, cast or an ace bandage. Compression decreases swelling and improves comfort. Compression should be tight enough to relieve swelling but not so tight as to decrease circulation. Increasing pain, numbness, tingling, or change in skin color, are all signs of decreased circulation.   ELEVATE: Elevate the injured part. For example, an arm can be elevated by using a sling while standing and sitting or by propping it up on pillows while lying down.    You have been given a sling and swath for your injury. This will help to keep the injured part elevated and improve  comfort.    YOU SHOULD SEEK MEDICAL ATTENTION IMMEDIATELY, EITHER HERE OR AT THE NEAREST EMERGENCY DEPARTMENT, IF ANY OF THE FOLLOWING OCCURS:   You experience a severe increase in pain or swelling in the affected area.   You develop numbness and tingling in or below the affected area.   You develop a cold, pale hand that appears to have a problem with its blood supply.

## 2012-01-21 NOTE — ED Notes (Signed)
Pt. States she fell at approximately 1600 today and was seen at urgent care who said she had a "seperated shoulder"

## 2012-09-27 ENCOUNTER — Other Ambulatory Visit: Payer: Self-pay | Admitting: Obstetrics & Gynecology

## 2012-10-04 ENCOUNTER — Ambulatory Visit: Payer: BC Managed Care – PPO

## 2012-10-04 VITALS — Ht 61.0 in | Wt 170.0 lb

## 2012-10-04 NOTE — Pre-Procedure Instructions (Addendum)
PATIENT WILL HAVE EKG CLEARANCE AND LABS WITH PCP. WILL TAKE ACCUPRIL,METOPROLOL,LYRICA & CARDURA IN AM AND ARRIVE 1130 FOR 1300 SURGERY ON 10/23/2012. FBS ON ARRIVAL

## 2012-10-09 ENCOUNTER — Ambulatory Visit
Admission: RE | Admit: 2012-10-09 | Discharge: 2012-10-09 | Disposition: A | Discharge status: Home / Self Care | Payer: BC Managed Care – PPO | Source: Ambulatory Visit | Attending: Obstetrics & Gynecology | Admitting: Obstetrics & Gynecology

## 2012-10-09 DIAGNOSIS — Z1231 Encounter for screening mammogram for malignant neoplasm of breast: Secondary | ICD-10-CM | POA: Insufficient documentation

## 2012-10-18 NOTE — Anesthesia Preprocedure Evaluation (Addendum)
Anesthesia Evaluation    AIRWAY    Mallampati: III    TM distance: <3 FB  Neck ROM: full  Mouth Opening:full   CARDIOVASCULAR    cardiovascular exam normal       DENTAL    No notable dental hx     PULMONARY    pulmonary exam normal     OTHER FINDINGS                      Anesthesia Plan    ASA 3     general               (EKG (05/04/2012): SB, PRWP  PCP (10/11/2012): > 4 mets activity; "acceptable risk/medically cleared")      intravenous induction         Post Op: other  Post op pain management: per surgeon    informed consent obtained    Plan discussed with CRNA.    ECG reviewed               Inpatient Anesthesia Evaluation    Patient Name: Kara [name]  Surgeon: Jerel Shepherd, MD  Patient Age / Sex: 57 y.o. / female    Medical History:     Past Medical History   Diagnosis Date   . Hypertensive disorder    . Hyperlipidemia    . Diabetes mellitus without complication    . Hydrocephalus    . Anxiety    . Post-operative nausea and vomiting    . Sleep apnea      USES CPAP NIGHTLY   . Type 2 diabetes mellitus, controlled      BLOOD SUGAR  = OR <       125   . Neuropathy      PAIN IN FEET    . Gastroesophageal reflux disease      TAKES PROTONIX   . Low back pain        Past Surgical History   Procedure Date   . Cesarean section 1991   . Shunt for hydrocephalus 2008         Allergies:   No Known Allergies      Medications:     No current facility-administered medications for this visit.     Facility-Administered Medications Ordered in Other Visits   Medication Dose Route Frequency Last Rate Last Dose   . buffered lidocaine (XYLOCAINE) 1 % solution 0.3 mL  0.3 mL Intradermal Once       . ceFAZolin (ANCEF) injection 2 g  2 g Intravenous Once       . lactated ringers infusion 1,000 mL  1,000 mL Intravenous Continuous       . scopolamine (TRANSDERM-SCOP) 1.5 MG 1 patch  1 patch Transdermal Q72H                  Prior to Admission medications    Medication Sig Start Date End Date Taking? Authorizing Provider   acyclovir  (ZOVIRAX) 400 MG tablet Take 400 mg by mouth 2 (two) times daily.  12/19/11   [provider]   ALPRAZolam Prudy Feeler) 0.25 MG tablet Take 0.25 mg by mouth as needed.    [provider]   Ascorbic Acid (VITAMIN C) 500 MG tablet Take 500 mg by mouth daily.    [provider]   calcium-vitamin D (OSCAL) 250-125 MG-UNIT per tablet Take 1 tablet by mouth daily.    [provider]   doxazosin (  CARDURA) 4 MG tablet Take 4 mg by mouth every morning.    [provider]   ELDERBERRY PO Take by mouth.    [provider]   fenofibrate (TRIGLIDE) 160 MG tablet Take 160 mg by mouth every evening.    [provider]   hydrochlorothiazide (HYDRODIURIL) 25 MG tablet every morning.  12/16/11   [provider]   JANUMET 50-500 MG per tablet Take 1 tablet by mouth 2 (two) times daily with meals.  01/04/12   [provider]   LOVAZA 1 G capsule Take 1 g by mouth 2 (two) times daily.  12/16/11   [provider]   LYRICA 100 MG capsule Take 100 mg by mouth 2 (two) times daily.  10/21/11   [provider]   metoprolol XL (TOPROL-XL) 50 MG 24 hr tablet Take 50 mg by mouth every morning.  12/16/11   [provider]   Multiple Vitamin (MULTIVITAMIN) capsule Take 1 capsule by mouth daily.    [provider]   pantoprazole (PROTONIX) 40 MG tablet Take 40 mg by mouth every evening.  12/29/11   [provider]   PARoxetine (PAXIL) 10 MG tablet Take 10 mg by mouth nightly.  12/12/11   [provider]   quinapril (ACCUPRIL) 40 MG tablet Take 20 mg by mouth every morning.  12/16/11   [provider]   simvastatin (ZOCOR) 20 MG tablet Take 20 mg by mouth nightly.    [provider]   VITAMIN D-3 SUPER STRENGTH 2000 UNIT PO TABS Take 2,000 Units by mouth daily.    [provider]   WELCHOL 3.75 G PACK every morning.  12/16/11   [provider]     Vitals   Temp:  [98.1 F (36.7 C)] 98.1  F (36.7 C)  Heart Rate:  [50] 50   Resp Rate:  [16] 16   BP: (145)/(68) 145/68 mmHg    Wt Readings from Last 3 Encounters:   10/23/12 78.8 kg (173 lb 11.6 oz)   10/23/12 78.8 kg (173 lb 11.6 oz)   10/04/12 77.111 kg (170 lb)     BMI (Estimated Body mass index is 32.14 kg/(m^2) as calculated from the following:    Height as of 10/04/12: 5\' 1" (1.549 m).    Weight as of 10/04/12: 170 lb(77.111 kg).)  Temp Readings from Last 3 Encounters:   10/23/12 98.1 F (36.7 C) Temporal Artery   10/23/12 98.1 F (36.7 C) Temporal Artery   01/21/12 98.3 F (36.8 C)      BP Readings from Last 3 Encounters:   10/23/12 145/68   10/23/12 145/68   01/21/12 162/72     Pulse Readings from Last 3 Encounters:   10/23/12 50   10/23/12 50   01/21/12 50             _____________________      Signed by: Wanita Chamberlain  10/23/2012   12:47 PM

## 2012-10-23 ENCOUNTER — Ambulatory Visit: Payer: BC Managed Care – PPO | Admitting: Surgery

## 2012-10-23 ENCOUNTER — Encounter
Admission: RE | Disposition: A | Discharge status: Home / Self Care | Payer: Self-pay | Source: Ambulatory Visit | Attending: Surgery

## 2012-10-23 ENCOUNTER — Encounter: Payer: Self-pay | Admitting: Anesthesiology

## 2012-10-23 ENCOUNTER — Ambulatory Visit
Admission: RE | Admit: 2012-10-23 | Discharge: 2012-10-23 | Disposition: A | Discharge status: Home / Self Care | Payer: BC Managed Care – PPO | Source: Ambulatory Visit | Attending: Surgery | Admitting: Surgery

## 2012-10-23 ENCOUNTER — Ambulatory Visit: Payer: BC Managed Care – PPO | Admitting: Anesthesiology

## 2012-10-23 DIAGNOSIS — E785 Hyperlipidemia, unspecified: Secondary | ICD-10-CM | POA: Insufficient documentation

## 2012-10-23 DIAGNOSIS — E119 Type 2 diabetes mellitus without complications: Secondary | ICD-10-CM | POA: Insufficient documentation

## 2012-10-23 DIAGNOSIS — K219 Gastro-esophageal reflux disease without esophagitis: Secondary | ICD-10-CM | POA: Insufficient documentation

## 2012-10-23 DIAGNOSIS — K429 Umbilical hernia without obstruction or gangrene: Secondary | ICD-10-CM | POA: Insufficient documentation

## 2012-10-23 DIAGNOSIS — G609 Hereditary and idiopathic neuropathy, unspecified: Secondary | ICD-10-CM | POA: Insufficient documentation

## 2012-10-23 DIAGNOSIS — G4733 Obstructive sleep apnea (adult) (pediatric): Secondary | ICD-10-CM | POA: Insufficient documentation

## 2012-10-23 DIAGNOSIS — I10 Essential (primary) hypertension: Secondary | ICD-10-CM | POA: Insufficient documentation

## 2012-10-23 HISTORY — PX: REPAIR, UMBILICAL HERNIA, MESH: SHX5331

## 2012-10-23 LAB — POCT GLUCOSE: Whole Blood Glucose POCT: 106 mg/dL — AB (ref 70–100)

## 2012-10-23 SURGERY — REPAIR, UMBILICAL HERNIA, MESH
Anesthesia: Anesthesia General | Site: Abdomen | Wound class: Clean Contaminated

## 2012-10-23 MED ORDER — CEFAZOLIN SODIUM 1 G IJ SOLR
INTRAMUSCULAR | Status: DC
Start: 2012-10-23 — End: 2012-10-23
  Filled 2012-10-23: qty 2000

## 2012-10-23 MED ORDER — VASOPRESSIN 20 UNIT/ML IJ SOLN
INTRAMUSCULAR | Status: DC | PRN
Start: 2012-10-23 — End: 2012-10-23
  Administered 2012-10-23: 1 [IU] via INTRAVENOUS

## 2012-10-23 MED ORDER — FENTANYL CITRATE 0.05 MG/ML IJ SOLN
INTRAMUSCULAR | Status: DC | PRN
Start: 2012-10-23 — End: 2012-10-23
  Administered 2012-10-23: 100 ug via INTRAVENOUS

## 2012-10-23 MED ORDER — MIDAZOLAM HCL 2 MG/2ML IJ SOLN
INTRAMUSCULAR | Status: DC | PRN
Start: 2012-10-23 — End: 2012-10-23
  Administered 2012-10-23: 2 mg via INTRAVENOUS

## 2012-10-23 MED ORDER — ONDANSETRON HCL 4 MG/2ML IJ SOLN
INTRAMUSCULAR | Status: DC | PRN
Start: 2012-10-23 — End: 2012-10-23
  Administered 2012-10-23: 4 mg via INTRAVENOUS

## 2012-10-23 MED ORDER — BUPIVACAINE HCL (PF) 0.25 % IJ SOLN
INTRAMUSCULAR | Status: AC
Start: 2012-10-23 — End: ?
  Filled 2012-10-23: qty 30

## 2012-10-23 MED ORDER — DEXAMETHASONE SODIUM PHOSPHATE 4 MG/ML IJ SOLN
INTRAMUSCULAR | Status: AC
Start: 2012-10-23 — End: ?
  Filled 2012-10-23: qty 1

## 2012-10-23 MED ORDER — LACTATED RINGERS IV SOLN
1000.0000 mL | INTRAVENOUS | Status: DC
Start: 2012-10-23 — End: 2012-10-23
  Administered 2012-10-23: 1000 mL via INTRAVENOUS

## 2012-10-23 MED ORDER — GLYCOPYRROLATE 0.2 MG/ML IJ SOLN
INTRAMUSCULAR | Status: DC | PRN
Start: 2012-10-23 — End: 2012-10-23
  Administered 2012-10-23: .6 mg via INTRAVENOUS

## 2012-10-23 MED ORDER — FENTANYL CITRATE 0.05 MG/ML IJ SOLN
INTRAMUSCULAR | Status: AC
Start: 2012-10-23 — End: ?
  Filled 2012-10-23: qty 2

## 2012-10-23 MED ORDER — PROPOFOL 10 MG/ML IV EMUL
INTRAVENOUS | Status: AC
Start: 2012-10-23 — End: ?
  Filled 2012-10-23: qty 20

## 2012-10-23 MED ORDER — LIDOCAINE HCL 2 % IJ SOLN
INTRAMUSCULAR | Status: DC | PRN
Start: 2012-10-23 — End: 2012-10-23
  Administered 2012-10-23: 60 mg

## 2012-10-23 MED ORDER — DEXAMETHASONE SODIUM PHOSPHATE 4 MG/ML IJ SOLN (WRAP)
INTRAMUSCULAR | Status: DC | PRN
Start: 2012-10-23 — End: 2012-10-23
  Administered 2012-10-23: 4 mg via INTRAVENOUS

## 2012-10-23 MED ORDER — ROCURONIUM BROMIDE 50 MG/5ML IV SOLN
INTRAVENOUS | Status: DC | PRN
Start: 2012-10-23 — End: 2012-10-23
  Administered 2012-10-23: 35 mg via INTRAVENOUS

## 2012-10-23 MED ORDER — MIDAZOLAM HCL 2 MG/2ML IJ SOLN
INTRAMUSCULAR | Status: AC
Start: 2012-10-23 — End: ?
  Filled 2012-10-23: qty 2

## 2012-10-23 MED ORDER — EPHEDRINE SULFATE 50 MG/ML IJ SOLN
INTRAMUSCULAR | Status: AC
Start: 2012-10-23 — End: ?
  Filled 2012-10-23: qty 1

## 2012-10-23 MED ORDER — PROPOFOL INFUSION 10 MG/ML
INTRAVENOUS | Status: DC | PRN
Start: 2012-10-23 — End: 2012-10-23
  Administered 2012-10-23: 150 mg via INTRAVENOUS

## 2012-10-23 MED ORDER — CEFAZOLIN SODIUM 1 G IJ SOLR
2.0000 g | Freq: Once | INTRAMUSCULAR | Status: AC
Start: 2012-10-23 — End: 2012-10-23
  Administered 2012-10-23: 2 g via INTRAVENOUS

## 2012-10-23 MED ORDER — LIDOCAINE HCL (PF) 2 % IJ SOLN
INTRAMUSCULAR | Status: AC
Start: 2012-10-23 — End: ?
  Filled 2012-10-23: qty 5

## 2012-10-23 MED ORDER — EPHEDRINE SULFATE 50 MG/ML IJ SOLN
INTRAMUSCULAR | Status: DC | PRN
Start: 2012-10-23 — End: 2012-10-23
  Administered 2012-10-23 (×2): 10 mg via INTRAVENOUS

## 2012-10-23 MED ORDER — SCOPOLAMINE 1 MG/3DAYS TD PT72
MEDICATED_PATCH | TRANSDERMAL | Status: DC
Start: 2012-10-23 — End: 2012-10-23
  Administered 2012-10-23: 1 via TRANSDERMAL
  Filled 2012-10-23: qty 1

## 2012-10-23 MED ORDER — NEOSTIGMINE METHYLSULFATE 1 MG/ML IJ SOLN
INTRAMUSCULAR | Status: DC | PRN
Start: 2012-10-23 — End: 2012-10-23
  Administered 2012-10-23: 3 mg via INTRAVENOUS

## 2012-10-23 MED ORDER — ONDANSETRON HCL 4 MG/2ML IJ SOLN
INTRAMUSCULAR | Status: AC
Start: 2012-10-23 — End: ?
  Filled 2012-10-23: qty 2

## 2012-10-23 MED ORDER — SCOPOLAMINE 1 MG/3DAYS TD PT72
1.0000 | MEDICATED_PATCH | TRANSDERMAL | Status: DC
Start: 2012-10-23 — End: 2012-10-23

## 2012-10-23 MED ORDER — HYDROCODONE-ACETAMINOPHEN 5-325 MG PO TABS
1.0000 | ORAL_TABLET | ORAL | Status: AC | PRN
Start: 2012-10-23 — End: 2012-11-02

## 2012-10-23 MED ORDER — BUPIVACAINE HCL 0.25 % IJ SOLN
INTRAMUSCULAR | Status: DC | PRN
Start: 2012-10-23 — End: 2012-10-23
  Administered 2012-10-23: 30 mL

## 2012-10-23 MED ORDER — GLYCOPYRROLATE 1 MG/5ML IJ SOLN
INTRAMUSCULAR | Status: AC
Start: 2012-10-23 — End: ?
  Filled 2012-10-23: qty 5

## 2012-10-23 MED ORDER — ROCURONIUM BROMIDE 50 MG/5ML IV SOLN
INTRAVENOUS | Status: AC
Start: 2012-10-23 — End: ?
  Filled 2012-10-23: qty 5

## 2012-10-23 MED ORDER — LIDOCAINE 1% BUFFERED - CNR/OUTSOURCED
0.3000 mL | Freq: Once | INTRAMUSCULAR | Status: AC
Start: 2012-10-23 — End: 2012-10-23
  Administered 2012-10-23: 0.3 mL via INTRADERMAL

## 2012-10-23 SURGICAL SUPPLY — 41 items
APPLCATOR CHLORAPREP 26ML (Prep) ×2 IMPLANT
BLADE CLIPPER SPECIALTY (Procedure Accessories) IMPLANT
DRAPE SRG TBRN CNVRT 122X106X77IN LF (Drape) ×2
DRAPE SURGICAL IMPERVIOUS REINFORCEMENT FENESTRATE ABSORBENT ARMBOARD (Drape) ×1 IMPLANT
DRESSING TRANSPARENT L4 3/4 IN X W4 IN (Dressing)
DRESSING TRANSPARENT L4 3/4 IN X W4 IN POLYURETHANE ADHESIVE (Dressing) IMPLANT
DRESSING TRNS PU STD TGDRM 4.75X4IN LF (Dressing)
GAUZE SPONGE VERSLN 4PLY 4X4IN (Dressing) IMPLANT
GLOVE SURG SUPER-SENSER SZ6 (Glove) ×2 IMPLANT
GOWN SURG MICROCOOL STRL LG (Gown) ×6 IMPLANT
MANIFOLD NEPTUNE II 4 PORT (Procedure Accessories) ×2 IMPLANT
MASTISOL VIAL 2/3CC STRL (Skin Closure) ×2 IMPLANT
NEEDLE INJ SFTY 22GX1.5IN (Needles) ×2 IMPLANT
PAD ELECTROSRG GRND REM W CRD (Procedure Accessories) ×2 IMPLANT
PATCH SRG SPRMSH SORBAFLEX MED CRC (Mesh) ×1 IMPLANT
PATCH SRGCL MD 2.5IN VENTRALEX SEPRAMESH SORBAFLEX UMBILICL HRN RPR (Mesh) IMPLANT
PATCH SURGICAL MEDIUM CIRCLE STRAP OD2.5 (Mesh) ×1 IMPLANT
STRIP SKIN CLOSURE L4 IN X W1/2 IN (Dressing) ×1
STRIP SKIN CLOSURE L4 IN X W1/2 IN REINFORCE STERI-STRIP POLYESTER (Dressing) ×1 IMPLANT
STRIP SKNCLS PLSTR STRSTRP 4X.5IN LF (Dressing) ×1
SUTURE ABS 3-0 SH VCL 27IN BRD COAT UD (Suture) ×1
SUTURE ABS 3-0 SH-1 VCL 27IN BRD COAT UD (Suture) ×1
SUTURE ABS 4-0 PC5 VCL MTPS 18IN BRD (Suture) ×1
SUTURE COATED VICRYL 3-0 SH L27 IN BRAID (Suture) ×1 IMPLANT
SUTURE COATED VICRYL 3-0 SH-1 L27 IN (Suture) ×1
SUTURE COATED VICRYL 3-0 SH-1 L27 IN BRAID COATED UNDYED ABSORBABLE (Suture) ×1 IMPLANT
SUTURE COATED VICRYL 4-0 PC-5 L18 IN (Suture) ×1
SUTURE COATED VICRYL 4-0 PC-5 L18 IN BRAID COATED UNDYED ABSORBABLE (Suture) ×1 IMPLANT
SUTURE ETHIBOND EXCEL 0 CT-1 L30 IN (Suture)
SUTURE ETHIBOND EXCEL 0 CT-1 L30 IN BRAID NONABSORBABLE (Suture) IMPLANT
SUTURE NABSB 0 CT1 EBND EXC 30IN BRD (Suture)
SUTURE VICRYL 0 CT1 36IN (Suture) IMPLANT
SUTURE VICRYL 0 UR6 27IN (Suture) IMPLANT
SUTURE VICRYL 0-0 CT1 (Suture) IMPLANT
SUTURE VICRYL 2-0 CT-2 (Suture) IMPLANT
SUTURE VICRYL 2-0 SH 27IN (Suture) ×2 IMPLANT
SYRINGE LEUR LOK TIP 30 ML (Syringes, Needles) ×2 IMPLANT
TAPE SRG SFT CLTH MDPR H 10YDX3IN LF (Tape)
TAPE SRGCL 10YDX3IN HYPOALLERGENIC WTR RSSTNT MEDIPORE H SFT CLTH (Tape) IMPLANT
TAPE SURGICAL L10 YD X W3 IN (Tape)
TRAY MINOR (Pack) ×2 IMPLANT

## 2012-10-23 NOTE — Op Note (Signed)
Umbilical Hernia Repair With Mesh Operative Report    Date Time: 10/23/2012 2:02 PM  Patient Name: Kara [name]  Attending Physician: Jerel Shepherd, MD      Date of Operation:   10/23/2012    Providers Performing:   Surgeon(s):  Larron Armor, Lyndal Pulley, MD    Operative Procedure:   Procedure(s):  REPAIR, UMBILICAL HERNIA, MESH    Preoperative Diagnosis:   Pre-Op Diagnosis Codes:     * Umbilical hernia without mention of obstruction or gangrene [553.1]    Postoperative Diagnosis:   Umbilical hernia without mention of obstruction or gangrene [553.1][    Anesthesia:   General    Estimated Blood Loss:   10cc    Clinical History:   @PATIENTNAME @ is [name] 57 y.o. female who has noted umbilical pain and swelling. Exam demonstrates an umbilical hernia. After [name] detailed discussion regarding management options for umbilical hernias, the patient is eager to proceed with elective repair. We discussed the typical perioperative course, benefits, alternatives and potential risks of bleeding, infection, scarring, recurrence, or neurologic changes. Patient understood and wished to proceed.      Description Of Procedure:   The patient was taken to the operating suite, placed in the supine   position, and general anesthesia was induced. The abdomen was prepped and   draped in the usual sterile fashion. [name] periumbilical incision was created   and dissection carried through subcutaneous tissues to skeletonize the   hernia sac and incarcerated fat. The cautery was used to excise the sac   and fat with no specimen submitted. The patient's hernia defect measured   1.5cm and [name] Medium Ventralex patch was selected for repair. The wound was   irrigated and hemostasis confirmed. The surrounding peritoneal cavity was   inspected digitally and no adhesions noted. The patch was impregnated in   bacitracin and saline solution, folded and placed through the defect. The straps   were elevated and the patch was placed in position and secured with [name]    series of 0 Vicryl sutures approximating the fascia as well. Strap was excised. The wound was further irrigated with bacitracin and saline solution and the site confirmed to be hemostatic. Umbilicopexy was performed with 2-0 Vicryl. Subcutaneous   tissues were approximated with 3-0 Vicryl and finally, the skin approximated   with subcuticular 4-0 Vicryl. Mastisol, Steri-Strip, and dry dressing were   applied. The patient tolerated the procedure well, was extubated in the   operating room and then taken to recovery room in stable condition.      Signed by: Jerel Shepherd, MD  10/23/2012 2:02 PM

## 2012-10-23 NOTE — Discharge Instructions (Signed)
After Hernia Surgery  You can usually go home the same day as surgery. To speed healing, take an active role in your recovery. The tips below can help:     An ice pack helps reduce swelling.   Reducing Swelling  Early on, it's common for the area around your incision to be swollen, bruised, and sore. To reduce swelling, put an ice pack or bag of frozen peas in a thin towel. Place the towel on the swollen area3-5times a day for15-20 minutes at a time.  Managing Pain  Take any prescribed pain medications as directed. Be aware that some pain medications can cause constipation. So your doctor may also suggest a laxative or stool softener.  Returning to Normal  You can return to your normal routine as soon as you feel able. Just take it easy and follow these guidelines:   Take short walks to improve circulation.   Avoid heavy lifting for at least a week.   Ask your doctor about driving and returning to work.   You can begin having sex again when you feel ready.  Following Up  Be sure to keep all follow-up appointments with your doctor. These ensure you're healing well. During visits, your stitches, staples, or bandage may be removed.  Call your doctor if you have any of the following:   A large amount of swelling or bruising (some testicular swelling and bruising is normal)   Fever over101F   Bleeding   Increasing pain, redness, or drainage   Trouble urinating      2000-2014 [employer], [address], [place] [address]. All rights reserved. This information is not intended as a substitute for professional medical care. Always follow your healthcare professional's instructions.

## 2012-10-23 NOTE — Anesthesia Postprocedure Evaluation (Signed)
The patient is awake or easily arousable.      The patients respirations, and cardiovascular status have been evaluated and deemed stable post op.     Post op nausea, vomiting and pain have been treated and controlled as effectively as possible without compromising the patients respiratory and cardiovascular status.    Please refer to Post Op PACU Documentation for confirmation of attainment of normothermia and adequate hydration status.    There were no obvious anesthetic related complications.

## 2012-10-23 NOTE — Transfer of Care (Signed)
Anesthesia Transfer of Care Note    Patient: Kara [name]    Procedures performed: Procedure(s) with comments:  REPAIR, UMBILICAL HERNIA, MESH - UMBILICAL HERNIA REPAIR with MESH      Anesthesia type: General ETT    Patient location:PACU    Last vitals:   Filed Vitals:    10/23/12 1401   BP: 173/76   Pulse: 60   Temp: 97.8 F (36.6 C)   Resp: 12   SpO2: 96%       Post pain: Patient not complaining of pain, continue current therapy      Mental Status:awake    Respiratory Function: tolerating nasal cannula    Cardiovascular: stable    Nausea/Vomiting: patient not complaining of nausea or vomiting    Hydration Status: adequate    Post assessment: no apparent anesthetic complications, no reportable events and no evidence of recall  Report given to PACU RN; pt exchanging well, comfortable, and stable.

## 2012-10-23 NOTE — Brief Op Note (Signed)
BRIEF OP NOTE    Date Time: 10/23/2012 2:01 PM    Patient Name:   Kara [name]    Date of Operation:   10/23/2012    Providers Performing:   Surgeon(s):  Reginald Mangels, Lyndal Pulley, MD    Operative Procedure:   Procedure(s):  REPAIR, UMBILICAL HERNIA, MESH    Preoperative Diagnosis:   Pre-Op Diagnosis Codes:     * Umbilical hernia without mention of obstruction or gangrene [553.1]    Postoperative Diagnosis:   Umbilical hernia without mention of obstruction or gangrene [553.1][    Anesthesia:   General    Estimated Blood Loss:   10cc    Implants:     Implant Name Type Inv. Item Serial No. Manufacturer Lot No. LRB No. Used Action   MESH MED CIRCLE W/STRAP 6.4CM - [id number] Mesh MESH MED CIRCLE W/STRAP 6.4CM     [id number] N/A 1 Implanted       Drains:   Drains: no    Specimens:     None    Findings:   1.5cm defect  Incarcerated fat  Thin fascia    Complications:   No immediate.      Signed by: Jerel Shepherd, MD                                                                           [facility]  10/23/2012  2:01 PM

## 2012-10-23 NOTE — Interval H&P Note (Signed)
H&P up to date--no changes--pt seen and examined

## 2013-05-15 ENCOUNTER — Other Ambulatory Visit: Payer: Self-pay | Admitting: Family Medicine

## 2013-05-15 DIAGNOSIS — R1011 Right upper quadrant pain: Secondary | ICD-10-CM

## 2013-05-18 ENCOUNTER — Ambulatory Visit
Admission: RE | Admit: 2013-05-18 | Discharge: 2013-05-18 | Disposition: A | Discharge status: Home / Self Care | Payer: BC Managed Care – PPO | Source: Ambulatory Visit | Attending: Family Medicine | Admitting: Family Medicine

## 2013-05-18 ENCOUNTER — Other Ambulatory Visit: Payer: Self-pay | Admitting: Family Medicine

## 2013-05-18 DIAGNOSIS — M542 Cervicalgia: Secondary | ICD-10-CM

## 2013-05-18 DIAGNOSIS — R1011 Right upper quadrant pain: Secondary | ICD-10-CM

## 2013-05-18 DIAGNOSIS — K7689 Other specified diseases of liver: Secondary | ICD-10-CM | POA: Insufficient documentation

## 2013-05-18 DIAGNOSIS — M5030 Other cervical disc degeneration, unspecified cervical region: Secondary | ICD-10-CM | POA: Insufficient documentation

## 2013-06-15 ENCOUNTER — Telehealth: Payer: BC Managed Care – PPO

## 2013-06-15 DIAGNOSIS — Z01818 Encounter for other preprocedural examination: Secondary | ICD-10-CM

## 2013-06-15 DIAGNOSIS — E119 Type 2 diabetes mellitus without complications: Secondary | ICD-10-CM

## 2013-06-15 NOTE — Pre-Procedure Instructions (Signed)
PATIENT WILL TAKE METOPROLOL, QUINOPRIL, HCTZ, CARDURA, ZOVIRAX AND LYRICA. WILL HAVE FBS ON ARRIVAL. WILL ARRIVE 0845 FOR 1015 SURGERY ON 06/18/2013

## 2013-06-17 NOTE — Anesthesia Preprocedure Evaluation (Addendum)
Anesthesia Evaluation    AIRWAY    Mallampati: III    TM distance: <3 FB  Neck ROM: full  Mouth Opening:full   CARDIOVASCULAR    cardiovascular exam normal       DENTAL    no notable dental hx     PULMONARY    pulmonary exam normal     OTHER FINDINGS              PSS Anesthesia Comments: No problems 9/14 surgery        Anesthesia Plan    ASA 3     general                     intravenous induction         Post Op: other  Post op pain management: per surgeon    informed consent obtained    Plan discussed with CRNA.    ECG reviewed (SB, LAD)               Inpatient Anesthesia Evaluation    Patient Name: Kara [name]  Surgeon: Jerel Shepherd, MD  Patient Age / Sex: 58 y.o. / female    Medical History:     Past Medical History   Diagnosis Date   . Hypertensive disorder    . Hyperlipidemia    . Diabetes mellitus without complication    . Hydrocephalus 2008     SHUNT IN    . Anxiety    . Post-operative nausea and vomiting    . Sleep apnea      USES CPAP NIGHTLY   . Type 2 diabetes mellitus, controlled      BLOOD SUGAR  = OR <       125   . Neuropathy      PAIN IN FEET    . Gastroesophageal reflux disease      TAKES PROTONIX   . Low back pain        Past Surgical History   Procedure Laterality Date   . Cesarean section  1991   . Shunt for hydrocephalus  2008   . Repair, umbilical hernia, mesh  10/23/2012     Procedure: REPAIR, UMBILICAL HERNIA, MESH;  Surgeon: Jerel Shepherd, MD;  Location: [facility];  Service: General;  Laterality: N/[name];  UMBILICAL HERNIA REPAIR with MESH           Allergies:   No Known Allergies      Medications:     Current Facility-Administered Medications   Medication Dose Route Frequency Last Rate Last Dose   . ceFAZolin (ANCEF) injection 2 g  2 g Intravenous On Call to OR       . lactated ringers infusion   Intravenous Continuous 20 mL/hr at 06/18/13 0958 1,000 mL at 06/18/13 0958              Prior to Admission medications    Medication Sig Start Date End Date Taking? Authorizing  Provider   acyclovir (ZOVIRAX) 400 MG tablet Take 400 mg by mouth 2 (two) times daily.  12/19/11  Yes [provider]   ALPRAZolam (XANAX) 0.25 MG tablet Take 0.25 mg by mouth as needed.   Yes [provider]   Ascorbic Acid (VITAMIN C) 500 MG tablet Take 500 mg by mouth daily.   Yes [provider]   calcium-vitamin D (OSCAL) 250-125 MG-UNIT per tablet Take 1 tablet by mouth daily.   Yes [provider]  doxazosin (CARDURA) 4 MG tablet Take 4 mg by mouth every morning.   Yes [provider]   ELDERBERRY PO Take by mouth.   Yes [provider]   fenofibrate (TRIGLIDE) 160 MG tablet Take 160 mg by mouth every evening.   Yes [provider]   hydrochlorothiazide (HYDRODIURIL) 25 MG tablet every morning.  12/16/11  Yes [provider]   JANUMET 50-500 MG per tablet Take 1 tablet by mouth 2 (two) times daily with meals.  01/04/12  Yes [provider]   LOVAZA 1 G capsule Take 1 g by mouth 2 (two) times daily.  12/16/11  Yes [provider]   LYRICA 100 MG capsule Take 100 mg by mouth 2 (two) times daily.  10/21/11  Yes [provider]   metoprolol XL (TOPROL-XL) 50 MG 24 hr tablet Take 50 mg by mouth every morning.  12/16/11  Yes [provider]   Multiple Vitamin (MULTIVITAMIN) capsule Take 1 capsule by mouth daily.   Yes [provider]   pantoprazole (PROTONIX) 40 MG tablet Take 40 mg by mouth every evening.  12/29/11  Yes [provider]   PARoxetine (PAXIL) 10 MG tablet Take 10 mg by mouth nightly.  12/12/11  Yes [provider]   quinapril (ACCUPRIL) 40 MG tablet Take 20 mg by mouth every morning.  12/16/11  Yes [provider]   simvastatin (ZOCOR) 20 MG tablet Take 20 mg by mouth nightly.   Yes [provider]   VITAMIN D-3 SUPER STRENGTH 2000 UNIT PO TABS Take 2,000 Units by mouth daily.   Yes [provider]   WELCHOL 3.75 G PACK every morning.   12/16/11  Yes [provider]     Vitals   Temp:  [36.9 C (98.5 F)] 36.9 C (98.5 F)  Heart Rate:  [54] 54  Resp Rate:  [18] 18  BP: (152)/(69) 152/69 mmHg    Wt Readings from Last 3 Encounters:   06/18/13 80.65 kg (177 lb 12.8 oz)   06/18/13 80.65 kg (177 lb 12.8 oz)   10/23/12 78.8 kg (173 lb 11.6 oz)     BMI (Estimated body mass index is 34.72 kg/(m^2) as calculated from the following:    Height as of this encounter: 1.524 m (5').    Weight as of this encounter: 80.65 kg (177 lb 12.8 oz).)  Temp Readings from Last 3 Encounters:   06/18/13 36.9 C (98.5 F) Temporal Artery   06/18/13 36.9 C (98.5 F) Temporal Artery   10/23/12 36.8 C (98.3 F)      BP Readings from Last 3 Encounters:   06/18/13 152/69   06/18/13 152/69   10/23/12 143/65     Pulse Readings from Last 3 Encounters:   06/18/13 54   06/18/13 54   10/23/12 52             _____________________      Signed by: Wanita Chamberlain  06/18/2013   10:03 AM

## 2013-06-18 ENCOUNTER — Ambulatory Visit: Payer: BC Managed Care – PPO | Admitting: Anesthesiology

## 2013-06-18 ENCOUNTER — Encounter: Payer: Self-pay | Admitting: Anesthesiology

## 2013-06-18 ENCOUNTER — Encounter
Admission: RE | Disposition: A | Discharge status: Home / Self Care | Payer: Self-pay | Source: Ambulatory Visit | Attending: Surgery

## 2013-06-18 ENCOUNTER — Encounter: Payer: BC Managed Care – PPO | Admitting: Anesthesiology

## 2013-06-18 ENCOUNTER — Ambulatory Visit: Payer: Self-pay

## 2013-06-18 ENCOUNTER — Ambulatory Visit
Admission: RE | Admit: 2013-06-18 | Discharge: 2013-06-18 | Disposition: A | Discharge status: Home / Self Care | Payer: BC Managed Care – PPO | Source: Ambulatory Visit | Attending: Surgery | Admitting: Surgery

## 2013-06-18 ENCOUNTER — Ambulatory Visit: Payer: BC Managed Care – PPO | Admitting: Surgery

## 2013-06-18 DIAGNOSIS — Z01818 Encounter for other preprocedural examination: Secondary | ICD-10-CM

## 2013-06-18 DIAGNOSIS — K824 Cholesterolosis of gallbladder: Secondary | ICD-10-CM | POA: Insufficient documentation

## 2013-06-18 DIAGNOSIS — G4730 Sleep apnea, unspecified: Secondary | ICD-10-CM | POA: Insufficient documentation

## 2013-06-18 DIAGNOSIS — I10 Essential (primary) hypertension: Secondary | ICD-10-CM | POA: Insufficient documentation

## 2013-06-18 DIAGNOSIS — E119 Type 2 diabetes mellitus without complications: Secondary | ICD-10-CM | POA: Insufficient documentation

## 2013-06-18 DIAGNOSIS — Z9889 Other specified postprocedural states: Secondary | ICD-10-CM | POA: Insufficient documentation

## 2013-06-18 DIAGNOSIS — K8010 Calculus of gallbladder with chronic cholecystitis without obstruction: Secondary | ICD-10-CM

## 2013-06-18 DIAGNOSIS — E785 Hyperlipidemia, unspecified: Secondary | ICD-10-CM | POA: Insufficient documentation

## 2013-06-18 DIAGNOSIS — K219 Gastro-esophageal reflux disease without esophagitis: Secondary | ICD-10-CM | POA: Insufficient documentation

## 2013-06-18 DIAGNOSIS — K811 Chronic cholecystitis: Secondary | ICD-10-CM | POA: Insufficient documentation

## 2013-06-18 DIAGNOSIS — F411 Generalized anxiety disorder: Secondary | ICD-10-CM | POA: Insufficient documentation

## 2013-06-18 HISTORY — PX: ROBOT ASSISTED, LAPAROSCOPIC, CHOLECYSTECTOMY: SHX5488

## 2013-06-18 LAB — GLUCOSE WHOLE BLOOD - POCT: Whole Blood Glucose POCT: 152 mg/dL — ABNORMAL HIGH (ref 70–100)

## 2013-06-18 SURGERY — ROBOT ASSISTED, LAPAROSCOPIC, CHOLECYSTECTOMY - USE 297
Anesthesia: Anesthesia General | Site: Abdomen | Wound class: Clean Contaminated

## 2013-06-18 MED ORDER — KETOROLAC TROMETHAMINE 30 MG/ML IJ SOLN
INTRAMUSCULAR | Status: AC
Start: 2013-06-18 — End: ?
  Filled 2013-06-18: qty 1

## 2013-06-18 MED ORDER — LIDOCAINE HCL (PF) 2 % IJ SOLN
INTRAMUSCULAR | Status: AC
Start: 2013-06-18 — End: ?
  Filled 2013-06-18: qty 5

## 2013-06-18 MED ORDER — HYDROCODONE-ACETAMINOPHEN 5-325 MG PO TABS
1.0000 | ORAL_TABLET | ORAL | Status: AC | PRN
Start: 2013-06-18 — End: 2013-06-28

## 2013-06-18 MED ORDER — LIDOCAINE 1% BUFFERED - CNR/OUTSOURCED
0.3000 mL | Freq: Once | INTRAMUSCULAR | Status: AC
Start: 2013-06-18 — End: 2013-06-18
  Administered 2013-06-18: 0.3 mL via INTRADERMAL

## 2013-06-18 MED ORDER — HYDROCODONE-ACETAMINOPHEN 5-325 MG PO TABS
1.0000 | ORAL_TABLET | Freq: Once | ORAL | Status: AC
Start: 2013-06-18 — End: 2013-06-18

## 2013-06-18 MED ORDER — EPHEDRINE SULFATE 50 MG/ML IJ SOLN
INTRAMUSCULAR | Status: DC | PRN
Start: 2013-06-18 — End: 2013-06-18
  Administered 2013-06-18: 10 mg via INTRAVENOUS

## 2013-06-18 MED ORDER — LACTATED RINGERS IV SOLN
INTRAVENOUS | Status: DC
Start: 2013-06-18 — End: 2013-06-18

## 2013-06-18 MED ORDER — PROPOFOL 10 MG/ML IV EMUL
INTRAVENOUS | Status: AC
Start: 2013-06-18 — End: ?
  Filled 2013-06-18: qty 20

## 2013-06-18 MED ORDER — NEOSTIGMINE METHYLSULFATE 1 MG/ML IJ SOLN
INTRAMUSCULAR | Status: AC
Start: 2013-06-18 — End: ?
  Filled 2013-06-18: qty 10

## 2013-06-18 MED ORDER — HYDROCODONE-ACETAMINOPHEN 5-325 MG PO TABS
ORAL_TABLET | ORAL | Status: AC
Start: 2013-06-18 — End: 2013-06-18
  Administered 2013-06-18: 1 via ORAL
  Filled 2013-06-18: qty 1

## 2013-06-18 MED ORDER — FENTANYL CITRATE 0.05 MG/ML IJ SOLN
INTRAMUSCULAR | Status: AC
Start: 2013-06-18 — End: ?
  Filled 2013-06-18: qty 5

## 2013-06-18 MED ORDER — GLYCOPYRROLATE 1 MG/5ML IJ SOLN
INTRAMUSCULAR | Status: AC
Start: 2013-06-18 — End: ?
  Filled 2013-06-18: qty 5

## 2013-06-18 MED ORDER — DIPHENHYDRAMINE HCL 50 MG/ML IJ SOLN
12.5000 mg | INTRAMUSCULAR | Status: DC | PRN
Start: 2013-06-18 — End: 2013-06-18

## 2013-06-18 MED ORDER — BUPIVACAINE HCL 0.25 % IJ SOLN
INTRAMUSCULAR | Status: DC | PRN
Start: 2013-06-18 — End: 2013-06-18
  Administered 2013-06-18: 30 mL

## 2013-06-18 MED ORDER — ONDANSETRON HCL 4 MG/2ML IJ SOLN
INTRAMUSCULAR | Status: AC
Start: 2013-06-18 — End: ?
  Filled 2013-06-18: qty 2

## 2013-06-18 MED ORDER — CITRIC ACID-SODIUM CITRATE 334-500 MG/5ML PO SOLN
30.0000 mL | ORAL | Status: DC | PRN
Start: 2013-06-18 — End: 2013-06-18

## 2013-06-18 MED ORDER — LIDOCAINE HCL 2 % IJ SOLN
INTRAMUSCULAR | Status: DC | PRN
Start: 2013-06-18 — End: 2013-06-18
  Administered 2013-06-18: 100 mg

## 2013-06-18 MED ORDER — LACTATED RINGERS IV SOLN
INTRAVENOUS | Status: DC
Start: 2013-06-18 — End: 2013-06-18
  Administered 2013-06-18: 1000 mL via INTRAVENOUS

## 2013-06-18 MED ORDER — CEFAZOLIN SODIUM 1 G IJ SOLR
2.0000 g | INTRAMUSCULAR | Status: AC
Start: 2013-06-18 — End: 2013-06-18
  Administered 2013-06-18: 2 g via INTRAVENOUS

## 2013-06-18 MED ORDER — CEFAZOLIN SODIUM 1 G IJ SOLR
2.0000 g | INTRAMUSCULAR | Status: DC
Start: 2013-06-18 — End: 2013-06-18

## 2013-06-18 MED ORDER — ROCURONIUM BROMIDE 50 MG/5ML IV SOLN
INTRAVENOUS | Status: AC
Start: 2013-06-18 — End: ?
  Filled 2013-06-18: qty 5

## 2013-06-18 MED ORDER — FAMOTIDINE 20 MG PO TABS
20.0000 mg | ORAL_TABLET | Freq: Once | ORAL | Status: DC | PRN
Start: 2013-06-18 — End: 2013-06-18

## 2013-06-18 MED ORDER — MEPERIDINE HCL 25 MG/ML IJ SOLN
12.5000 mg | INTRAMUSCULAR | Status: DC | PRN
Start: 2013-06-18 — End: 2013-06-18

## 2013-06-18 MED ORDER — DEXAMETHASONE SODIUM PHOSPHATE 10 MG/ML IJ SOLN
INTRAMUSCULAR | Status: AC
Start: 2013-06-18 — End: ?
  Filled 2013-06-18: qty 1

## 2013-06-18 MED ORDER — HYDROMORPHONE HCL PF 1 MG/ML IJ SOLN
0.5000 mg | INTRAMUSCULAR | Status: DC | PRN
Start: 2013-06-18 — End: 2013-06-18

## 2013-06-18 MED ORDER — MIDAZOLAM HCL 2 MG/2ML IJ SOLN
INTRAMUSCULAR | Status: DC | PRN
Start: 2013-06-18 — End: 2013-06-18
  Administered 2013-06-18: 2 mg via INTRAVENOUS

## 2013-06-18 MED ORDER — FENTANYL CITRATE 0.05 MG/ML IJ SOLN
INTRAMUSCULAR | Status: DC | PRN
Start: 2013-06-18 — End: 2013-06-18
  Administered 2013-06-18: 150 ug via INTRAVENOUS
  Administered 2013-06-18: 25 ug via INTRAVENOUS

## 2013-06-18 MED ORDER — HYDROMORPHONE HCL PF 1 MG/ML IJ SOLN
INTRAMUSCULAR | Status: AC
Start: 2013-06-18 — End: 2013-06-18
  Administered 2013-06-18: 0.25 mg via INTRAVENOUS
  Filled 2013-06-18: qty 1

## 2013-06-18 MED ORDER — GLYCOPYRROLATE 0.2 MG/ML IJ SOLN
INTRAMUSCULAR | Status: DC | PRN
Start: 2013-06-18 — End: 2013-06-18
  Administered 2013-06-18 (×2): 0.2 mg via INTRAVENOUS
  Administered 2013-06-18: .6 mg via INTRAVENOUS

## 2013-06-18 MED ORDER — ONDANSETRON HCL 4 MG/2ML IJ SOLN
INTRAMUSCULAR | Status: AC
Start: 2013-06-18 — End: 2013-06-18
  Administered 2013-06-18: 4 mg via INTRAVENOUS
  Filled 2013-06-18: qty 2

## 2013-06-18 MED ORDER — KETOROLAC TROMETHAMINE 30 MG/ML IJ SOLN
INTRAMUSCULAR | Status: DC | PRN
Start: 2013-06-18 — End: 2013-06-18
  Administered 2013-06-18: 30 mg via INTRAVENOUS

## 2013-06-18 MED ORDER — NEOSTIGMINE METHYLSULFATE 1 MG/ML IJ SOLN
INTRAMUSCULAR | Status: DC | PRN
Start: 2013-06-18 — End: 2013-06-18
  Administered 2013-06-18: 4 mg via INTRAVENOUS

## 2013-06-18 MED ORDER — PROMETHAZINE HCL 25 MG/ML IJ SOLN
6.2500 mg | Freq: Once | INTRAMUSCULAR | Status: DC | PRN
Start: 2013-06-18 — End: 2013-06-18

## 2013-06-18 MED ORDER — BUPIVACAINE HCL (PF) 0.25 % IJ SOLN
INTRAMUSCULAR | Status: AC
Start: 2013-06-18 — End: ?
  Filled 2013-06-18: qty 30

## 2013-06-18 MED ORDER — ONDANSETRON HCL 4 MG/2ML IJ SOLN
4.0000 mg | Freq: Once | INTRAMUSCULAR | Status: AC | PRN
Start: 2013-06-18 — End: 2013-06-18

## 2013-06-18 MED ORDER — ONDANSETRON HCL 4 MG/2ML IJ SOLN
INTRAMUSCULAR | Status: DC | PRN
Start: 2013-06-18 — End: 2013-06-18
  Administered 2013-06-18: 4 mg via INTRAVENOUS

## 2013-06-18 MED ORDER — MIDAZOLAM HCL 2 MG/2ML IJ SOLN
INTRAMUSCULAR | Status: AC
Start: 2013-06-18 — End: ?
  Filled 2013-06-18: qty 2

## 2013-06-18 MED ORDER — DEXAMETHASONE SODIUM PHOSPHATE 4 MG/ML IJ SOLN (WRAP)
INTRAMUSCULAR | Status: DC | PRN
Start: 2013-06-18 — End: 2013-06-18
  Administered 2013-06-18: 10 mg via INTRAVENOUS

## 2013-06-18 MED ORDER — ROCURONIUM BROMIDE 50 MG/5ML IV SOLN
INTRAVENOUS | Status: DC | PRN
Start: 2013-06-18 — End: 2013-06-18
  Administered 2013-06-18: 40 mg via INTRAVENOUS

## 2013-06-18 SURGICAL SUPPLY — 45 items
BLADE CLIPPER ASSEMBLY (Procedure Accessories) IMPLANT
CATH URETHRAL XRAY WHIST 5F (Catheter Urine) IMPLANT
CLIP INTERNAL MEDIUM LARGE LIGATE (Clips) ×2
CLIP INTERNAL MEDIUM LARGE LIGATE NONABSORBABLE CARTRIDGE WECK (Clips) ×2 IMPLANT
CLIP INTNL PLMR MED LG WECK HEM-O-LOK LF (Clips) ×2
CORD ELECTROSURGICAL L300 CM UNIPOLAR (Cable) ×1
CORD ELECTROSURGICAL L300 CM UNIPOLAR HIGH FREQUENCY OD8 MM AUTOCON (Cable) ×1 IMPLANT
CORD ESURG 8MM 300CM UNPLR HFRQ (Cable) ×1
DRAPE 3-ARM (Drape) ×2
DRAPE ACCESSORY 3 ARM DA VINCI 20X13X10.5IN 420290 (Drape) ×1 IMPLANT
GLOVE SURG SUPER-SENSER SZ6 (Glove) ×4 IMPLANT
GOWN SRG PRFRM FBRC LG STDLN AERO BLU LF (Gown) ×1
GOWN SURGICAL LARGE STANDARD LENGTH (Gown) ×1
GOWN SURGICAL LARGE STANDARD LENGTH PERFORMANCE FABRIC AERO BLUE GREEN (Gown) ×1 IMPLANT
IRRIGATOR SUCTN PUMP/HANDPIECE (Suction) ×2 IMPLANT
KIT GENERAL ROBOTIC GENERIC (Kits) ×2 IMPLANT
MASTISOL VIAL 2/3CC STRL (Skin Closure) ×2 IMPLANT
OBTURATOR BLDLS DISP 8MM (Procedure Accessories) ×2
OBTURATOR ROBOTIC BLADELESS SHORT DA VINCI SI ENDOWRIST 8MM 420023 (Procedure Accessories) ×1 IMPLANT
PAD POSITIONER CRADLE HEAD PINK DEVON 4X8X9IN FOAM ADULT 31143129 (Positioning Supplies) ×1 IMPLANT
POSITIONER HEAD FOAM ADULT (Positioning Supplies) ×2
POUCH SPEC RTRVL PU E-CTCH GLD 10MM 34.5 (Laparoscopy Supplies) ×1
POUCH SPECIMEN RETRIEVAL L34.5 CM (Laparoscopy Supplies) ×1
POUCH SPECIMEN RETRIEVAL L34.5 CM ERGONOMIC HANDLE LONG CYLINDRICAL (Laparoscopy Supplies) ×1 IMPLANT
PROTECTOR NERVE ULNAR (Positioning Supplies) IMPLANT
RETRACTOR ENDOGRAB PORT FREE (Retractor) ×2 IMPLANT
SOLUTION IV LACTATED RINGERS 1000 ML (IV Solutions)
SOLUTION IV LACTATED RINGERS 1000 ML PLASTIC CONTAINER (IV Solutions) IMPLANT
SOLUTION IV LR 1000ML VFLX LF PLS CNTNR (IV Solutions)
SPONGE CHLRPRP TINT 26ML (Applicator) ×2 IMPLANT
SPONGE HEMOSTATIC SURGICEL 4X8 (Dressing) ×2 IMPLANT
SUTURE ABS 4-0 PC5 VCL MTPS 18IN BRD (Suture) ×1
SUTURE COATED VICRYL 4-0 PC-5 L18 IN (Suture) ×1
SUTURE COATED VICRYL 4-0 PC-5 L18 IN BRAID COATED UNDYED ABSORBABLE (Suture) ×1 IMPLANT
SUTURE ETHIBOND EXCEL GREEN 0 SH L30 IN (Suture) ×1
SUTURE ETHIBOND EXCEL GREEN 0 SH L30 IN BRAID NONABSORBABLE (Suture) ×1 IMPLANT
SUTURE NABSB 0 SH EBND EXC 30IN BRD GRN (Suture) ×1
SUTURE VICRYL 0 UR6 27IN (Suture) ×2 IMPLANT
SYSTEM IMAGING 8X6IN CLEARIFY MICROFIBER WARM HUB TRCR WIPE DSPSBL (Kits) ×1 IMPLANT
SYSTEM IMG MRFBR CLEARIFY 8X6IN WRM HUB (Kits) ×2
TIP CAUT 8MM STD HOT SHR DVNC EWRST CAUT (Procedure Accessories)
TIP ELECTROCAUTERY HOT SHEARS DA VINCI ENDOWRIST CAUTERY MONOPOLAR 8 (Procedure Accessories) IMPLANT
WATER STERILE PLASTIC POUR BOTTLE 1000 (Irrigation Solutions) ×1
WATER STERILE PLASTIC POUR BOTTLE 1000 ML (Irrigation Solutions) ×1 IMPLANT
WATER STRL 1000ML PLS PR BTL LF (Irrigation Solutions) ×1

## 2013-06-18 NOTE — PACU (Signed)
Received pt sedated with oral airway with spontaneous breathing hooked on nasal canula at 5lpm.

## 2013-06-18 NOTE — Transfer of Care (Signed)
Anesthesia Transfer of Care Note    Patient: Kara [name]    Procedures performed: Procedure(s) with comments:  ROBOT ASSISTED, LAPAROSCOPIC, CHOLECYSTECTOMY - ROBOT ASSISTED, LAPAROSCOPIC, CHOLECYSTECTOMY      Anesthesia type: General ETT    Patient location:Phase I PACU    Last vitals:   Filed Vitals:    06/18/13 1136   BP:    Pulse: 55   Temp: 36.1 C (97 F)   Resp: 13   SpO2: 96%       Post pain: Patient not complaining of pain, continue current therapy      Mental Status:lethargic    Respiratory Function: tolerating nasal cannula    Cardiovascular: stable    Nausea/Vomiting: patient not complaining of nausea or vomiting    Hydration Status: adequate    Post assessment: no apparent anesthetic complications   Report to [facility]. Patient opens eyes to verbal. OPA remains in place.

## 2013-06-18 NOTE — Brief Op Note (Signed)
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      Signed by: Jerel Shepherd, MD                                                                           [facility]  06/18/2013  11:32 AM

## 2013-06-18 NOTE — Interval H&P Note (Signed)
H&P up to date--pt seen and examined--no changes

## 2013-06-18 NOTE — Discharge Instructions (Signed)
After Gallbladder Surgery  You can usually go home the same day as your surgery. In some cases, you may need to stay overnight. Once you're at home, be sure to follow all your doctor's instructions.  In the Hospital  Bandages will cover your incisions and you may have special boots on your legs to prevent blood clots.To aid recovery, you'll be asked to get up and move as soon as possible. You may also be asked to use a device that helps keep your lungs clear.  At Home  You can get back to your normal routine as soon as you feel able. To speed healing:   Take any prescribed pain medications as directed.   Follow your doctor's instructions about bathing and caring for your incisions.   Walk and move around as often as possible.   Ask your doctor about driving and going back to work. This is often about5-10 days after surgery.  Eating Normally Again  Removing the gallbladder doesn't mean you have to be on a special diet. But you may want to start with light meals. It can also take a few weeks for your digestion to adjust. You may have indigestion, loose stools, or diarrhea. This is normal and should go away in time.  Following Up  Keep follow-up appointments during your recovery. These allow your doctor to check your progress and answer any questions. Be sure to mention if you have any new symptoms. Also mention if you have diarrhea that doesn't go away.    Call your doctor if you have any of the following:   Fever over101For chills   Increasing pain, redness, or drainage at an incision site   Vomiting or nausea that lasts more than12 hours   Prolonged diarrhea      2000-2014 [employer], [address], [place] [address]. All rights reserved. This information is not intended as a substitute for professional medical care. Always follow your healthcare professional's instructions.

## 2013-06-18 NOTE — PACU (Signed)
1227 Assumed care from [facility] pt resting O2 via NC sat 98-99% Lap sites dry and intact sequentials on

## 2013-06-18 NOTE — PACU (Signed)
Pt discharged to home, IV removed, discharge instructions reviewed with pt and husband, pt educated to not take pain meds and drive, pt educated on s/s to watch for and to make follow up appointment. Pt educated on care of lap sites, and re-enforced to not take xanax until 24 hrs after surg. Pt educated to ambulate and move around and that laying in bed all the time is contra-indicated.  Pt educated to not lift and no strenous activity until cleared by Careers adviser.  Pt belongings removed with pt and family and pt taken to lobby via wheel chair.

## 2013-06-18 NOTE — Addendum Note (Signed)
Addendum created 06/18/13 1523 by Eulis Foster, CRNA    Modules edited: Anesthesia LDA, Lines/Drains/Airways Properties Editor    Lines/Drains/Airways Properties Editor:  Properties of line/drain/airway/wound [REMOVED] NG/OG Tube Orogastric 18 Fr. Center mouth have been modified.

## 2013-06-18 NOTE — PACU (Signed)
Pt still very sleepy , oral airway was pulled out without difficulty around 1203.

## 2013-06-18 NOTE — Anesthesia Postprocedure Evaluation (Addendum)
Anesthesia Post Evaluation    Patient: Kara [name]    Procedures performed: Procedure(s) with comments:  ROBOT ASSISTED, LAPAROSCOPIC, CHOLECYSTECTOMY - ROBOT ASSISTED, LAPAROSCOPIC, CHOLECYSTECTOMY      Anesthesia type: General ETT    Patient location:Phase II PACU    Post pain: Patient not complaining of pain, continue current therapy      Mental Status:awake    Respiratory Function: tolerating room air    Cardiovascular: stable    Nausea/Vomiting: patient not complaining of nausea or vomiting    Hydration Status: adequate    Post assessment: no apparent anesthetic complications

## 2013-06-18 NOTE — PACU (Signed)
Pt asking to take xanax, pt educated that she should not take that medication for 24 hours due to having surgery and having anesthesia because the two act together and could cause respiratory problems. Pt states that she understands and will resume prn schedule after 24 hours tomorrow.

## 2013-06-21 NOTE — Op Note (Signed)
Procedure Date: 06/18/2013     Patient Type: A     SURGEON: Jeanann Lewandowsky MD  ASSISTANT:       PREOPERATIVE DIAGNOSES:  1.  Gallbladder polyp.    2.  Cholecystitis.     POSTOPERATIVE DIAGNOSES:  1.  Gallbladder polyp.    2.  Cholecystitis.     TITLE OF PROCEDURE:  Robotic multiport cholecystectomy.     ANESTHESIA:  General endotracheal.     ESTIMATED BLOOD LOSS:  Less than 20 mL.     INDICATIONS FOR PROCEDURE:  Kara [name] is a 58 year old female known to me from recent umbilical  hernia repair who presents complaining of worsening postprandial upper  abdominal pain associated with gas and bloating.  Recent imaging confirmed  findings of a gallbladder polyp and after a detailed discussion regarding  gallbladder disease and management options, she was eager to proceed with  elective cholecystectomy.  We reviewed procedure options of open,  laparoscopic, and robotic techniques, discussed typical perioperative and  postoperative courses as well as benefits, alternatives, and potential  risks of bleeding, infection, bowel or biliary injury, need for an open or  subsequent procedure.  She understood, wished to proceed, and elected a  robotic approach.     DESCRIPTION OF PROCEDURE:  The patient was taken to the operating suite, placed in a supine position,  and general endotracheal anesthesia was induced.  The abdomen was prepped  and draped in the usual sterile fashion.  A transverse supraumbilical  incision was created, Veress needle introduced, and CO2 insufflation to 15  mmHg.  A 12-mm cannula was introduced.  With the patient in reverse  Trendelenburg and rolled to the left, and under direct visualization,  additional right and left lateral 8-mm cannulas were introduced.  Liver  surfaces demonstrated mild fatty changes.  Otherwise, no other unusual  intraabdominal findings were encountered.  The EndoGrab device was placed  to aid in gallbladder retraction.  Mild pericholecystic adhesions were  present.  At this  point, the da Vinci surgical robot was advanced to the  operative field and docked.  I proceeded to the console where adhesions  were taken down, fully exposing the gallbladder.  The cystic duct and  artery were each isolated carefully, clips were placed, and each structure  was divided.  The remaining dissection of the gallbladder from the liver  was performed utilizing the electrocautery.  The liver bed was inspected,  irrigated, and excess irrigant suctioned until clear.  Clips were noted to  be in place with no evidence of bleeding or bile leakage and liver bed was  noted to be hemostatic.  At this point, the robot was undocked.  I  proceeded to the operative field where the EndoGrab was removed and the  gallbladder was placed in an endoscopic retrieval bag and removed through  the umbilical site.  Local anesthetic of 0.25% Marcaine was infiltrated  into trocar sites and finally excess CO2 and trocars were removed.  The  patient's 12-mm site was at the margin of the superior aspect of the  previously placed mesh; 0 Ethibond sutures were placed in a figure-of-eight  fashion, approximating the superior fascia to the inferior mesh.  Wounds  were irrigated and subcuticular 4-0 Vicryl approximated the skin.   Mastisol, Steri-Strips, and dry dressings were applied.  The patient  tolerated the procedure well, was extubated in the operating room, and  taken to the recovery room in stable condition.  D:  06/21/2013 08:30 AM by Dr. Jeanann Lewandowsky, MD ([id number])  T:  06/21/2013 09:48 AM by       Everlean Cherry: [id number]) (Doc ID: [id number])

## 2013-06-28 ENCOUNTER — Other Ambulatory Visit: Payer: Self-pay | Admitting: Family Medicine

## 2013-06-28 DIAGNOSIS — M542 Cervicalgia: Secondary | ICD-10-CM

## 2013-07-04 ENCOUNTER — Ambulatory Visit
Admission: RE | Admit: 2013-07-04 | Discharge: 2013-07-04 | Disposition: A | Discharge status: Home / Self Care | Payer: BC Managed Care – PPO | Source: Ambulatory Visit | Attending: Family Medicine | Admitting: Family Medicine

## 2013-07-04 DIAGNOSIS — M47812 Spondylosis without myelopathy or radiculopathy, cervical region: Secondary | ICD-10-CM | POA: Insufficient documentation

## 2013-07-04 DIAGNOSIS — M542 Cervicalgia: Secondary | ICD-10-CM | POA: Insufficient documentation

## 2013-11-01 ENCOUNTER — Other Ambulatory Visit: Payer: Self-pay | Admitting: Obstetrics & Gynecology

## 2013-11-01 DIAGNOSIS — Z13820 Encounter for screening for osteoporosis: Secondary | ICD-10-CM

## 2013-11-01 DIAGNOSIS — Z1231 Encounter for screening mammogram for malignant neoplasm of breast: Secondary | ICD-10-CM

## 2013-11-07 ENCOUNTER — Ambulatory Visit: Payer: BC Managed Care – PPO

## 2013-11-08 ENCOUNTER — Ambulatory Visit
Admission: RE | Admit: 2013-11-08 | Discharge: 2013-11-08 | Disposition: A | Discharge status: Home / Self Care | Payer: BC Managed Care – PPO | Source: Ambulatory Visit | Attending: Obstetrics & Gynecology | Admitting: Obstetrics & Gynecology

## 2013-11-08 DIAGNOSIS — Z1231 Encounter for screening mammogram for malignant neoplasm of breast: Secondary | ICD-10-CM | POA: Insufficient documentation

## 2013-11-08 DIAGNOSIS — Z13820 Encounter for screening for osteoporosis: Secondary | ICD-10-CM | POA: Insufficient documentation

## 2014-07-17 ENCOUNTER — Emergency Department
Admission: EM | Admit: 2014-07-17 | Discharge: 2014-07-17 | Disposition: A | Discharge status: Home / Self Care | Payer: Commercial Managed Care - POS | Attending: Emergency Medicine | Admitting: Emergency Medicine

## 2014-07-17 ENCOUNTER — Emergency Department: Payer: Commercial Managed Care - POS

## 2014-07-17 DIAGNOSIS — I10 Essential (primary) hypertension: Secondary | ICD-10-CM | POA: Insufficient documentation

## 2014-07-17 DIAGNOSIS — Z87891 Personal history of nicotine dependence: Secondary | ICD-10-CM | POA: Insufficient documentation

## 2014-07-17 DIAGNOSIS — G919 Hydrocephalus, unspecified: Secondary | ICD-10-CM | POA: Insufficient documentation

## 2014-07-17 DIAGNOSIS — E119 Type 2 diabetes mellitus without complications: Secondary | ICD-10-CM | POA: Insufficient documentation

## 2014-07-17 DIAGNOSIS — S43001D Unspecified subluxation of right shoulder joint, subsequent encounter: Secondary | ICD-10-CM | POA: Insufficient documentation

## 2014-07-17 DIAGNOSIS — K219 Gastro-esophageal reflux disease without esophagitis: Secondary | ICD-10-CM | POA: Insufficient documentation

## 2014-07-17 DIAGNOSIS — E785 Hyperlipidemia, unspecified: Secondary | ICD-10-CM | POA: Insufficient documentation

## 2014-07-17 DIAGNOSIS — Z982 Presence of cerebrospinal fluid drainage device: Secondary | ICD-10-CM | POA: Insufficient documentation

## 2014-07-17 DIAGNOSIS — S43001A Unspecified subluxation of right shoulder joint, initial encounter: Secondary | ICD-10-CM

## 2014-07-17 DIAGNOSIS — W010XXD Fall on same level from slipping, tripping and stumbling without subsequent striking against object, subsequent encounter: Secondary | ICD-10-CM | POA: Insufficient documentation

## 2014-07-17 MED ORDER — HYDROCODONE-ACETAMINOPHEN 5-325 MG PO TABS
1.0000 | ORAL_TABLET | Freq: Four times a day (QID) | ORAL | Status: DC | PRN
Start: 2014-07-17 — End: 2017-05-19

## 2014-07-17 NOTE — ED Provider Notes (Signed)
Physician/Midlevel provider first contact with patient: 07/17/14 1645         History     Chief Complaint   Patient presents with   . Shoulder Pain     Patient is a 59 y.o. female presenting with arm injury. The history is provided by the patient. No language interpreter was used.   Arm Injury  Location:  Shoulder  Time since incident:  1 week  Injury: yes    Mechanism of injury: fall    Fall:     Fall occurred:  Tripped    Impact surface:  Hard floor    Point of impact: right shoulder.    Entrapped after fall: no    Shoulder location:  R shoulder  Pain details:     Quality:  Shooting and sharp    Radiates to:  Does not radiate    Severity:  Moderate    Onset quality:  Sudden    Timing:  Constant    Progression:  Unchanged  Chronicity:  New  Handedness:  Right-handed  Dislocation: yes (patient state it was dislocation but went back into place)    Prior injury to area:  No  Relieved by:  Nothing  Worsened by:  Movement  Ineffective treatments:  NSAIDs  Associated symptoms: decreased range of motion    Associated symptoms: no fever, no neck pain, no stiffness, no swelling and no tingling             Past Medical History   Diagnosis Date   . Hypertensive disorder    . Hyperlipidemia    . Diabetes mellitus without complication    . Hydrocephalus 2008     SHUNT IN    . Anxiety    . Post-operative nausea and vomiting    . Sleep apnea      USES CPAP NIGHTLY   . Type 2 diabetes mellitus, controlled      BLOOD SUGAR  = OR <       125   . Neuropathy      PAIN IN FEET    . Gastroesophageal reflux disease      TAKES PROTONIX   . Low back pain        Past Surgical History   Procedure Laterality Date   . Cesarean section  1991   . Shunt for hydrocephalus  2008   . Repair, umbilical hernia, mesh  10/23/2012     Procedure: REPAIR, UMBILICAL HERNIA, MESH;  Surgeon: Jerel Shepherd, MD;  Location: [facility];  Service: General;  Laterality: N/A;  UMBILICAL HERNIA REPAIR with MESH     . Robotic, cholecystectomy N/A 06/18/2013      Procedure: ROBOT ASSISTED, LAPAROSCOPIC, CHOLECYSTECTOMY;  Surgeon: Jerel Shepherd, MD;  Location: [facility];  Service: General;  Laterality: N/A;  ROBOT ASSISTED, LAPAROSCOPIC, CHOLECYSTECTOMY         Family History   Problem Relation Age of Onset   . Breast cancer Neg Hx    . Heart attack Father        Social  History   Substance Use Topics   . Smoking status: Former Smoker -- 1.00 packs/day for 11 years     Quit date: 10/04/1981   . Smokeless tobacco: Never Used   . Alcohol Use: Yes      Comment: ONCE IN A WHILE       .     No Known Allergies    Home Medications  Last Medication Reconciliation Action:  In Progress Virgie Dad, RN 07/17/2014  4:42 PM                  acyclovir (ZOVIRAX) 400 MG tablet     Take 400 mg by mouth 2 (two) times daily.      ALPRAZolam (XANAX) 0.25 MG tablet     Take 0.25 mg by mouth as needed.     Ascorbic Acid (VITAMIN C) 500 MG tablet     Take 500 mg by mouth daily.     calcium-vitamin D (OSCAL) 250-125 MG-UNIT per tablet     Take 1 tablet by mouth daily.     doxazosin (CARDURA) 4 MG tablet     Take 4 mg by mouth every morning.     ELDERBERRY PO     Take by mouth.     fenofibrate (TRIGLIDE) 160 MG tablet     Take 160 mg by mouth every evening.     hydrochlorothiazide (HYDRODIURIL) 25 MG tablet     every morning.      JANUMET 50-500 MG per tablet     Take 1 tablet by mouth 2 (two) times daily with meals.      LOVAZA 1 G capsule     Take 1 g by mouth 2 (two) times daily.      LYRICA 100 MG capsule     Take 100 mg by mouth 2 (two) times daily.      metoprolol XL (TOPROL-XL) 50 MG 24 hr tablet     Take 50 mg by mouth every morning.      Multiple Vitamin (MULTIVITAMIN) capsule     Take 1 capsule by mouth daily.     pantoprazole (PROTONIX) 40 MG tablet     Take 40 mg by mouth every evening.      PARoxetine (PAXIL) 10 MG tablet     Take 10 mg by mouth nightly.      quinapril (ACCUPRIL) 40 MG tablet     Take 20 mg by mouth every morning.      VITAMIN D-3 SUPER  STRENGTH 2000 UNIT PO TABS     Take 2,000 Units by mouth daily.           Flagged for Removal             simvastatin (ZOCOR) 20 MG tablet     Take 20 mg by mouth nightly.     WELCHOL 3.75 G PACK     every morning.            Review of Systems   Constitutional: Negative.  Negative for fever.   Musculoskeletal: Positive for arthralgias. Negative for stiffness and neck pain.   Skin: Negative.    All other systems reviewed and are negative.      Physical Exam    BP: 173/72 mmHg, Heart Rate: (!) 56, Temp: 98.2 F (36.8 C), Resp Rate: 17, SpO2: 97 %, Weight: 82.555 kg    Physical Exam   Constitutional: She is oriented to person, place, and time. She appears well-developed and well-nourished.   HENT:   Head: Normocephalic and atraumatic.   Neck: Normal range of motion.   Musculoskeletal:        Right shoulder: She exhibits decreased range of motion, tenderness and bony tenderness. She exhibits no swelling, no effusion, no crepitus, no deformity, no laceration, no spasm, normal pulse and normal strength.   Neurological: She is alert and oriented to person, place,  and time.   Skin: Skin is warm and dry.   Psychiatric: She has a normal mood and affect. Her behavior is normal. Judgment and thought content normal.   Nursing note and vitals reviewed.        MDM and ED Course     ED Medication Orders     None             MDM  Number of Diagnoses or Management Options  Shoulder subluxation, right, initial encounter:   Diagnosis management comments: The attending signature signifies review and agreement of the history , PE, evaluation, clinical impression and discharge plan except as otherwise noted.    I, Sharrie Self Albertine Grates, have been the primary provider for Jed Limerick during this Emergency Dept visit.    I reviewed nursing recorded vitals and history including PMSFHX    Oxygen saturation by pulse oximetry is 95%-100%, Normal.  Interventions: None Needed.    DDX to include, but not limited to:  Fracture, dislocation,  sprain  Plan:  X-ray suspicious for subluxation would only over dislocation or acute fracture.  Patient will be placed in a sling and advised to follow-up with orthopedic for further evaluation and treatment.         Amount and/or Complexity of Data Reviewed  Tests in the radiology section of CPT: ordered and reviewed  Review and summarize past medical records: yes    Risk of Complications, Morbidity, and/or Mortality  Presenting problems: low  Diagnostic procedures: low  Management options: low    Patient Progress  Patient progress: stable            Procedures    Results     ** No results found for the last 24 hours. **          Radiology Results (24 Hour)     Procedure Component Value Units Date/Time    Shoulder Right 2+ Views [[id number]] Collected:  07/17/14 1757    Order Status:  Completed Updated:  07/17/14 1804    Narrative:      Clinical history: Fall. Right shoulder pain.    COMPARISON: 01/21/2012.    Three views of the right shoulder demonstrate mild inferior subluxation  of the humeral head with respect to the glenoid and acromion, more so  than on the prior. However, the current transscapular view demonstrates  no overt joint dislocation. There is mild degenerative change at the  acromioclavicular and glenohumeral joints. There is no acute fracture or  suspicious osseous lesion. Bone mineral density is subjectively normal.      Impression:       Suspected right shoulder subluxation, without overt  dislocation or acute fracture.    Al Decant, MD   07/17/2014 6:00 PM            I have reviewed all labs and/or radiological studies. I have reviewed all xrays if any myself on the PACS system.    Clinical Impression & Disposition     Clinical Impression  Final diagnoses:   Shoulder subluxation, right, initial encounter        ED Disposition     Discharge Yzabelle A Krell discharge to home/self care.    Condition at disposition: Stable             Discharge Medication List as of 07/17/2014  6:16 PM       START taking these medications    Details   HYDROcodone-acetaminophen (NORCO) 5-325 MG per tablet Take 1 tablet  by mouth every 6 (six) hours as needed., Starting 07/17/2014, Until Discontinued, Print                         Carney Harder [place], [place]  07/17/14 2127

## 2014-07-17 NOTE — Discharge Instructions (Signed)
Shoulder Strain    You have been diagnosed with a shoulder strain.    The shoulder joint is surrounded by several muscles. A strain happens when a muscle is stretched or partly torn. This usually happens from using the muscle too much or doing an activity the muscle is not used to. Strains should not be confused with sprains. Sprains are injuries to the ligaments. Ligaments hold bones together.    An injury to the shoulder can be especially painful. This is because when you move your arm, you also have to move your shoulder.    Treatment often includes resting the shoulder and using pain medicines. A sling is sometimes used. If your doctor places you in a sling, it is important to take your arm out of the sling every 2 or 3 hours. Move it around. This way, your shoulder will not "freeze." If you get a "frozen shoulder," problems like more pain or severe (serious) stiffness may develop.    Treatment also includes using ice on the painful area. Putting ice on the affected area can reduce swelling and pain. Put some ice cubes in a re-sealable (Ziploc) bag and add some water. Put a thin washcloth between the bag and the skin. Apply the ice bag to the area for at least 20 minutes. Do this at least 4 times per day. It is okay to use the ice more often and to apply it longer. NEVER APPLY ICE DIRECTLY TO THE SKIN.    Try to keep the injured shoulder elevated (lifted). You can sit up in a chair or recliner and sleep on an extra pillow in bed at night.    Take the medicine your doctor has prescribed. These medicines often help with pain and inflammation.    YOU SHOULD SEEK MEDICAL ATTENTION IMMEDIATELY, EITHER HERE OR AT THE NEAREST EMERGENCY DEPARTMENT, IF ANY OF THE FOLLOWING OCCURS:   Numbness or tingling in your arm or hand.   A cool, pale hand.   Severe (serious) neck or shoulder pain that acetaminophen (Tylenol), ibuprofen (Advil or Motrin) or the medicine prescribed by the doctor does not help.

## 2014-08-30 ENCOUNTER — Other Ambulatory Visit: Payer: Self-pay | Admitting: Orthopaedic Surgery

## 2014-08-30 DIAGNOSIS — M19211 Secondary osteoarthritis, right shoulder: Secondary | ICD-10-CM

## 2014-08-30 DIAGNOSIS — M75101 Unspecified rotator cuff tear or rupture of right shoulder, not specified as traumatic: Secondary | ICD-10-CM

## 2014-09-06 ENCOUNTER — Ambulatory Visit
Admission: RE | Admit: 2014-09-06 | Discharge: 2014-09-06 | Disposition: A | Discharge status: Home / Self Care | Payer: Commercial Managed Care - POS | Source: Ambulatory Visit | Attending: Orthopaedic Surgery | Admitting: Orthopaedic Surgery

## 2014-09-06 DIAGNOSIS — M25411 Effusion, right shoulder: Secondary | ICD-10-CM | POA: Insufficient documentation

## 2014-09-06 DIAGNOSIS — M898X1 Other specified disorders of bone, shoulder: Secondary | ICD-10-CM | POA: Insufficient documentation

## 2014-09-06 DIAGNOSIS — M25811 Other specified joint disorders, right shoulder: Secondary | ICD-10-CM | POA: Insufficient documentation

## 2014-09-06 DIAGNOSIS — M65811 Other synovitis and tenosynovitis, right shoulder: Secondary | ICD-10-CM | POA: Insufficient documentation

## 2014-09-06 DIAGNOSIS — S46111A Strain of muscle, fascia and tendon of long head of biceps, right arm, initial encounter: Secondary | ICD-10-CM | POA: Insufficient documentation

## 2014-09-06 DIAGNOSIS — S46011A Strain of muscle(s) and tendon(s) of the rotator cuff of right shoulder, initial encounter: Secondary | ICD-10-CM | POA: Insufficient documentation

## 2014-09-06 DIAGNOSIS — X58XXXA Exposure to other specified factors, initial encounter: Secondary | ICD-10-CM | POA: Insufficient documentation

## 2014-09-06 DIAGNOSIS — M778 Other enthesopathies, not elsewhere classified: Secondary | ICD-10-CM | POA: Insufficient documentation

## 2014-09-06 DIAGNOSIS — M75101 Unspecified rotator cuff tear or rupture of right shoulder, not specified as traumatic: Secondary | ICD-10-CM

## 2014-09-06 DIAGNOSIS — M19211 Secondary osteoarthritis, right shoulder: Secondary | ICD-10-CM

## 2014-09-06 DIAGNOSIS — M71811 Other specified bursopathies, right shoulder: Secondary | ICD-10-CM | POA: Insufficient documentation

## 2014-09-06 DIAGNOSIS — M6289 Other specified disorders of muscle: Secondary | ICD-10-CM | POA: Insufficient documentation

## 2014-09-06 DIAGNOSIS — Y939 Activity, unspecified: Secondary | ICD-10-CM | POA: Insufficient documentation

## 2014-09-06 DIAGNOSIS — M948X1 Other specified disorders of cartilage, shoulder: Secondary | ICD-10-CM | POA: Insufficient documentation

## 2014-09-06 DIAGNOSIS — M24011 Loose body in right shoulder: Secondary | ICD-10-CM | POA: Insufficient documentation

## 2014-09-06 DIAGNOSIS — M25511 Pain in right shoulder: Secondary | ICD-10-CM | POA: Insufficient documentation

## 2015-05-13 ENCOUNTER — Encounter: Payer: Self-pay | Admitting: *Deleted

## 2015-05-15 NOTE — Discharge Instructions (Signed)

## 2015-05-19 ENCOUNTER — Ambulatory Visit: Payer: Managed Care, Other (non HMO) | Admitting: Student in an Organized Health Care Education/Training Program

## 2015-05-19 ENCOUNTER — Ambulatory Visit
Admission: RE | Admit: 2015-05-19 | Discharge: 2015-05-19 | Disposition: A | Discharge status: Home / Self Care | Payer: Managed Care, Other (non HMO) | Source: Ambulatory Visit | Attending: Ophthalmology | Admitting: Ophthalmology

## 2015-05-19 ENCOUNTER — Encounter
Admission: RE | Disposition: A | Discharge status: Home / Self Care | Payer: Self-pay | Source: Ambulatory Visit | Attending: Ophthalmology

## 2015-05-19 DIAGNOSIS — E7800 Pure hypercholesterolemia, unspecified: Secondary | ICD-10-CM | POA: Insufficient documentation

## 2015-05-19 DIAGNOSIS — E1142 Type 2 diabetes mellitus with diabetic polyneuropathy: Secondary | ICD-10-CM | POA: Diagnosis not present

## 2015-05-19 DIAGNOSIS — Z79899 Other long term (current) drug therapy: Secondary | ICD-10-CM | POA: Diagnosis not present

## 2015-05-19 DIAGNOSIS — Z7984 Long term (current) use of oral hypoglycemic drugs: Secondary | ICD-10-CM | POA: Diagnosis not present

## 2015-05-19 DIAGNOSIS — Z982 Presence of cerebrospinal fluid drainage device: Secondary | ICD-10-CM | POA: Diagnosis not present

## 2015-05-19 DIAGNOSIS — R0601 Orthopnea: Secondary | ICD-10-CM | POA: Insufficient documentation

## 2015-05-19 DIAGNOSIS — E1136 Type 2 diabetes mellitus with diabetic cataract: Secondary | ICD-10-CM | POA: Diagnosis not present

## 2015-05-19 DIAGNOSIS — G4730 Sleep apnea, unspecified: Secondary | ICD-10-CM | POA: Diagnosis not present

## 2015-05-19 DIAGNOSIS — K219 Gastro-esophageal reflux disease without esophagitis: Secondary | ICD-10-CM | POA: Diagnosis not present

## 2015-05-19 DIAGNOSIS — G912 (Idiopathic) normal pressure hydrocephalus: Secondary | ICD-10-CM | POA: Insufficient documentation

## 2015-05-19 DIAGNOSIS — Z87891 Personal history of nicotine dependence: Secondary | ICD-10-CM | POA: Diagnosis not present

## 2015-05-19 DIAGNOSIS — Z9889 Other specified postprocedural states: Secondary | ICD-10-CM | POA: Diagnosis not present

## 2015-05-19 DIAGNOSIS — Z7982 Long term (current) use of aspirin: Secondary | ICD-10-CM | POA: Diagnosis not present

## 2015-05-19 DIAGNOSIS — I10 Essential (primary) hypertension: Secondary | ICD-10-CM | POA: Diagnosis not present

## 2015-05-19 DIAGNOSIS — H25041 Posterior subcapsular polar age-related cataract, right eye: Secondary | ICD-10-CM | POA: Insufficient documentation

## 2015-05-19 DIAGNOSIS — H269 Unspecified cataract: Secondary | ICD-10-CM | POA: Diagnosis present

## 2015-05-19 HISTORY — DX: Hydrocephalus, unspecified: G91.9

## 2015-05-19 HISTORY — DX: Unspecified mononeuropathy of bilateral lower limbs: G57.93

## 2015-05-19 HISTORY — DX: Gastro-esophageal reflux disease without esophagitis: K21.9

## 2015-05-19 HISTORY — DX: Dizziness and giddiness: R42

## 2015-05-19 HISTORY — DX: Anxiety disorder, unspecified: F41.9

## 2015-05-19 HISTORY — DX: Unspecified rotator cuff tear or rupture of right shoulder, not specified as traumatic: M75.101

## 2015-05-19 HISTORY — DX: Motion sickness, initial encounter: T75.3XXA

## 2015-05-19 HISTORY — DX: Sleep apnea, unspecified: G47.30

## 2015-05-19 HISTORY — DX: Depression, unspecified: F32.A

## 2015-05-19 HISTORY — PX: CATARACT EXTRACTION W/PHACO: SHX586

## 2015-05-19 HISTORY — DX: Unspecified thoracic, thoracolumbar and lumbosacral intervertebral disc disorder: M51.9

## 2015-05-19 HISTORY — DX: Other specified postprocedural states: Z98.890

## 2015-05-19 HISTORY — DX: Type 2 diabetes mellitus without complications: E11.9

## 2015-05-19 HISTORY — DX: Other specified postprocedural states: R11.2

## 2015-05-19 HISTORY — DX: Essential (primary) hypertension: I10

## 2015-05-19 HISTORY — DX: Major depressive disorder, single episode, unspecified: F32.9

## 2015-05-19 LAB — GLUCOSE, CAPILLARY
GLUCOSE-CAPILLARY: 126 mg/dL — AB (ref 65–99)
Glucose-Capillary: 140 mg/dL — ABNORMAL HIGH (ref 65–99)

## 2015-05-19 SURGERY — PHACOEMULSIFICATION, CATARACT, WITH IOL INSERTION
Anesthesia: Monitor Anesthesia Care | Site: Eye | Laterality: Right | Wound class: Clean

## 2015-05-19 MED ORDER — ONDANSETRON HCL 4 MG/2ML IJ SOLN
4.0000 mg | Freq: Once | INTRAMUSCULAR | Status: AC
  Administered 2015-05-19: 4 mg via INTRAVENOUS

## 2015-05-19 MED ORDER — POVIDONE-IODINE 5 % OP SOLN
1.0000 "application " | OPHTHALMIC | Status: DC | PRN
  Administered 2015-05-19: 1 via OPHTHALMIC

## 2015-05-19 MED ORDER — NA HYALUR & NA CHOND-NA HYALUR 0.4-0.35 ML IO KIT
PACK | INTRAOCULAR | Status: DC | PRN
  Administered 2015-05-19: 1 mL via INTRAOCULAR

## 2015-05-19 MED ORDER — LACTATED RINGERS IV SOLN: INTRAVENOUS | Status: DC

## 2015-05-19 MED ORDER — EPINEPHRINE HCL 1 MG/ML IJ SOLN
INTRAOCULAR | Status: DC | PRN
  Administered 2015-05-19: 98 mL via OPHTHALMIC
  Administered 2015-05-19: 08:00:00 via OPHTHALMIC

## 2015-05-19 MED ORDER — TIMOLOL MALEATE 0.5 % OP SOLN
OPHTHALMIC | Status: DC | PRN
  Administered 2015-05-19: 1 [drp] via OPHTHALMIC

## 2015-05-19 MED ORDER — ARMC OPHTHALMIC DILATING GEL
1.0000 "application " | OPHTHALMIC | Status: DC | PRN
  Administered 2015-05-19 (×2): 1 via OPHTHALMIC

## 2015-05-19 MED ORDER — TETRACAINE HCL 0.5 % OP SOLN
1.0000 [drp] | OPHTHALMIC | Status: DC | PRN
  Administered 2015-05-19: 1 [drp] via OPHTHALMIC

## 2015-05-19 MED ORDER — BRIMONIDINE TARTRATE 0.2 % OP SOLN
OPHTHALMIC | Status: DC | PRN
  Administered 2015-05-19: 1 [drp] via OPHTHALMIC

## 2015-05-19 MED ORDER — FENTANYL CITRATE (PF) 100 MCG/2ML IJ SOLN
25.0000 ug | INTRAMUSCULAR | Status: DC | PRN
  Administered 2015-05-19: 50 ug via INTRAVENOUS

## 2015-05-19 MED ORDER — CEFUROXIME OPHTHALMIC INJECTION 1 MG/0.1 ML
INJECTION | OPHTHALMIC | Status: DC | PRN
  Administered 2015-05-19: 0.1 mL via INTRACAMERAL

## 2015-05-19 MED ORDER — OXYCODONE HCL 5 MG PO TABS: 5.0000 mg | ORAL_TABLET | Freq: Once | ORAL | Status: DC | PRN

## 2015-05-19 MED ORDER — OXYCODONE HCL 5 MG/5ML PO SOLN: 5.0000 mg | Freq: Once | ORAL | Status: DC | PRN

## 2015-05-19 MED ORDER — MIDAZOLAM HCL 2 MG/2ML IJ SOLN
INTRAMUSCULAR | Status: DC | PRN
  Administered 2015-05-19: 2 mg via INTRAVENOUS

## 2015-05-19 MED ORDER — LIDOCAINE HCL (PF) 4 % IJ SOLN
INTRAOCULAR | Status: DC | PRN
  Administered 2015-05-19: 1 mL via OPHTHALMIC

## 2015-05-19 SURGICAL SUPPLY — 29 items
APPLICATOR COTTON TIP 3IN (MISCELLANEOUS) ×3 IMPLANT
CANNULA ANT/CHMB 27GA (MISCELLANEOUS) ×3 IMPLANT
DISSECTOR HYDRO NUCLEUS 50X22 (MISCELLANEOUS) ×3 IMPLANT
GLOVE BIO SURGEON STRL SZ7 (GLOVE) ×3 IMPLANT
GLOVE SURG LX 6.5 MICRO (GLOVE) ×2
GLOVE SURG LX STRL 6.5 MICRO (GLOVE) ×1 IMPLANT
GOWN STRL REUS W/ TWL LRG LVL3 (GOWN DISPOSABLE) ×2 IMPLANT
GOWN STRL REUS W/TWL LRG LVL3 (GOWN DISPOSABLE) ×4
LENS IOL ACRSF IQ ULTRA 8.5 (Intraocular Lens) ×1 IMPLANT
LENS IOL ACRYSOF IQ 8.5 (Intraocular Lens) ×3 IMPLANT
MARKER SKIN DUAL TIP RULER LAB (MISCELLANEOUS) ×3 IMPLANT
NEEDLE FILTER BLUNT 18X 1/2SAF (NEEDLE) ×2
NEEDLE FILTER BLUNT 18X1 1/2 (NEEDLE) ×1 IMPLANT
PACK CATARACT BRASINGTON (MISCELLANEOUS) ×3 IMPLANT
PACK EYE AFTER SURG (MISCELLANEOUS) ×3 IMPLANT
PACK OPTHALMIC (MISCELLANEOUS) ×3 IMPLANT
RING MALYGIN 7.0 (MISCELLANEOUS) IMPLANT
SOL BAL SALT 15ML (MISCELLANEOUS)
SOLUTION BAL SALT 15ML (MISCELLANEOUS) IMPLANT
SUT ETHILON 10-0 CS-B-6CS-B-6 (SUTURE)
SUT VICRYL  9 0 (SUTURE)
SUT VICRYL 9 0 (SUTURE) IMPLANT
SUTURE EHLN 10-0 CS-B-6CS-B-6 (SUTURE) IMPLANT
SYR 3ML LL SCALE MARK (SYRINGE) ×3 IMPLANT
SYR TB 1ML LUER SLIP (SYRINGE) ×3 IMPLANT
WATER STERILE IRR 250ML POUR (IV SOLUTION) ×3 IMPLANT
WATER STERILE IRR 500ML POUR (IV SOLUTION) IMPLANT
WICK EYE OCUCEL (MISCELLANEOUS) IMPLANT
WIPE NON LINTING 3.25X3.25 (MISCELLANEOUS) ×3 IMPLANT

## 2015-05-19 NOTE — H&P (Signed)
H+P reviewed and is up to date, please see paper chart.  

## 2015-05-19 NOTE — Transfer of Care (Signed)
Immediate Anesthesia Transfer of Care Note  Patient: Dana Willis  Procedure(s) Performed: Procedure(s) with comments: CATARACT EXTRACTION PHACO AND INTRAOCULAR LENS PLACEMENT (IOC) right eye (Right) - DIABETIC - oral meds CPAP LEAVE PT 1ST  Patient Location: PACU  Anesthesia Type: MAC  Level of Consciousness: awake, alert  and patient cooperative  Airway and Oxygen Therapy: Patient Spontanous Breathing and Patient connected to supplemental oxygen  Post-op Assessment: Post-op Vital signs reviewed, Patient's Cardiovascular Status Stable, Respiratory Function Stable, Patent Airway and No signs of Nausea or vomiting  Post-op Vital Signs: Reviewed and stable  Complications: No apparent anesthesia complications

## 2015-05-19 NOTE — Anesthesia Procedure Notes (Signed)
Procedure Name: MAC Performed by: Mersedes Alber Pre-anesthesia Checklist: Patient identified, Emergency Drugs available, Suction available, Timeout performed and Patient being monitored Patient Re-evaluated:Patient Re-evaluated prior to inductionOxygen Delivery Method: Nasal cannula Placement Confirmation: positive ETCO2     

## 2015-05-19 NOTE — Anesthesia Preprocedure Evaluation (Addendum)
Anesthesia Evaluation    History of Anesthesia Complications (+) PONV and history of anesthetic complications  Airway Mallampati: II  TM Distance: >3 FB Neck ROM: Full    Dental no notable dental hx.    Pulmonary sleep apnea , former smoker,    Pulmonary exam normal breath sounds clear to auscultation       Cardiovascular hypertension, Normal cardiovascular exam Rhythm:Regular Rate:Normal     Neuro/Psych PSYCHIATRIC DISORDERS Anxiety Depression  Neuromuscular disease    GI/Hepatic GERD  ,  Endo/Other  diabetes  Renal/GU      Musculoskeletal  (+) Arthritis ,   Abdominal   Peds  Hematology   Anesthesia Other Findings   Reproductive/Obstetrics                            Anesthesia Physical Anesthesia Plan  ASA: III  Anesthesia Plan: MAC   Post-op Pain Management:    Induction: Intravenous  Airway Management Planned:   Additional Equipment:   Intra-op Plan:   Post-operative Plan: Extubation in OR  Informed Consent: I have reviewed the patients History and Physical, chart, labs and discussed the procedure including the risks, benefits and alternatives for the proposed anesthesia with the patient or authorized representative who has indicated his/her understanding and acceptance.   Dental advisory given  Plan Discussed with: CRNA  Anesthesia Plan Comments:         Anesthesia Quick Evaluation

## 2015-05-19 NOTE — Op Note (Signed)
Date of Surgery: 05/19/2015  PREOPERATIVE DIAGNOSES: Visually significant posterior subcapsular cataract, right eye.  POSTOPERATIVE DIAGNOSES: Same  PROCEDURES PERFORMED: Cataract extraction with intraocular lens implant, right eye.  SURGEON: Devin GoingAnita P. Vin, M.D.  ANESTHESIA: MAC and topical  IMPLANTS: [id number] +18.5 D   Implant Name Type Inv. Item Serial No. Manufacturer Lot No. LRB No. Used  [id number]     [id number] ALCON   Right 1     COMPLICATIONS: None.  DESCRIPTION OF PROCEDURE: Therapeutic options were discussed with the patient preoperatively, including a discussion of risks and benefits of surgery. Informed consent was obtained. An IOL-Master and immersion biometry were used to take the lens measurements, and a dilated fundus exam was performed within 6 months of the surgical date.  The patient was premedicated and brought to the operating room and placed on the operating table in the supine position. After adequate anesthesia, the patient was prepped and draped in the usual sterile ophthalmic fashion. A wire lid speculum was inserted and the microscope was positioned. A Superblade was used to create a paracentesis site at the limbus and a small amount of dilute preservative free lidocaine was instilled into the anterior chamber, followed by dispersive viscoelastic. A clear corneal incision was created temporally using a 2.4 mm keratome blade. Capsulorrhexis was then performed. In situ phacoemulsification was performed.  Cortical material was removed with the irrigation-aspiration unit. Dispersive viscoelastic was instilled to open the capsular bag. A posterior chamber intraocular lens with the specifications above was inserted and positioned. Irrigation-aspiration was used to remove all viscoelastic. Cefuroxime 1cc was instilled into the anterior chamber, and the corneal incision was checked and found to be water tight. The eyelid speculum was removed.  The operative eye was covered  with protective goggles after instilling 1 drop of timolol and brimonidine. The patient tolerated the procedure well. There were no complications.

## 2015-05-19 NOTE — Anesthesia Postprocedure Evaluation (Signed)
Anesthesia Post Note  Patient: Dana Willis  Procedure(s) Performed: Procedure(s) (LRB): CATARACT EXTRACTION PHACO AND INTRAOCULAR LENS PLACEMENT (IOC) right eye (Right)  Patient location during evaluation: PACU Anesthesia Type: MAC Level of consciousness: awake and alert Pain management: pain level controlled Vital Signs Assessment: post-procedure vital signs reviewed and stable Respiratory status: spontaneous breathing, nonlabored ventilation, respiratory function stable and patient connected to nasal cannula oxygen Cardiovascular status: stable and blood pressure returned to baseline Anesthetic complications: no    Teodoro Jeffreys C

## 2015-05-20 ENCOUNTER — Encounter: Payer: Self-pay | Admitting: Ophthalmology

## 2015-06-16 ENCOUNTER — Encounter: Payer: Self-pay | Admitting: *Deleted

## 2015-06-20 NOTE — Discharge Instructions (Signed)

## 2015-06-23 ENCOUNTER — Ambulatory Visit
Admission: RE | Admit: 2015-06-23 | Discharge: 2015-06-23 | Disposition: A | Discharge status: Home / Self Care | Payer: Managed Care, Other (non HMO) | Source: Ambulatory Visit | Attending: Ophthalmology | Admitting: Ophthalmology

## 2015-06-23 ENCOUNTER — Ambulatory Visit: Payer: Managed Care, Other (non HMO) | Admitting: Anesthesiology

## 2015-06-23 ENCOUNTER — Encounter
Admission: RE | Disposition: A | Discharge status: Home / Self Care | Payer: Self-pay | Source: Ambulatory Visit | Attending: Ophthalmology

## 2015-06-23 DIAGNOSIS — Z87891 Personal history of nicotine dependence: Secondary | ICD-10-CM | POA: Insufficient documentation

## 2015-06-23 DIAGNOSIS — F419 Anxiety disorder, unspecified: Secondary | ICD-10-CM | POA: Diagnosis not present

## 2015-06-23 DIAGNOSIS — Z9889 Other specified postprocedural states: Secondary | ICD-10-CM | POA: Diagnosis not present

## 2015-06-23 DIAGNOSIS — G919 Hydrocephalus, unspecified: Secondary | ICD-10-CM | POA: Diagnosis not present

## 2015-06-23 DIAGNOSIS — Z7982 Long term (current) use of aspirin: Secondary | ICD-10-CM | POA: Insufficient documentation

## 2015-06-23 DIAGNOSIS — H269 Unspecified cataract: Secondary | ICD-10-CM | POA: Diagnosis present

## 2015-06-23 DIAGNOSIS — G4730 Sleep apnea, unspecified: Secondary | ICD-10-CM | POA: Insufficient documentation

## 2015-06-23 DIAGNOSIS — Z79899 Other long term (current) drug therapy: Secondary | ICD-10-CM | POA: Diagnosis not present

## 2015-06-23 DIAGNOSIS — R0601 Orthopnea: Secondary | ICD-10-CM | POA: Insufficient documentation

## 2015-06-23 DIAGNOSIS — K219 Gastro-esophageal reflux disease without esophagitis: Secondary | ICD-10-CM | POA: Insufficient documentation

## 2015-06-23 DIAGNOSIS — I10 Essential (primary) hypertension: Secondary | ICD-10-CM | POA: Insufficient documentation

## 2015-06-23 DIAGNOSIS — Z982 Presence of cerebrospinal fluid drainage device: Secondary | ICD-10-CM | POA: Insufficient documentation

## 2015-06-23 DIAGNOSIS — H2512 Age-related nuclear cataract, left eye: Secondary | ICD-10-CM | POA: Insufficient documentation

## 2015-06-23 DIAGNOSIS — E1140 Type 2 diabetes mellitus with diabetic neuropathy, unspecified: Secondary | ICD-10-CM | POA: Insufficient documentation

## 2015-06-23 DIAGNOSIS — E7800 Pure hypercholesterolemia, unspecified: Secondary | ICD-10-CM | POA: Diagnosis not present

## 2015-06-23 DIAGNOSIS — M1990 Unspecified osteoarthritis, unspecified site: Secondary | ICD-10-CM | POA: Diagnosis not present

## 2015-06-23 DIAGNOSIS — Z9842 Cataract extraction status, left eye: Secondary | ICD-10-CM | POA: Diagnosis not present

## 2015-06-23 HISTORY — PX: CATARACT EXTRACTION W/PHACO: SHX586

## 2015-06-23 LAB — GLUCOSE, CAPILLARY
Glucose-Capillary: 168 mg/dL — ABNORMAL HIGH (ref 65–99)
Glucose-Capillary: 165 mg/dL — ABNORMAL HIGH (ref 65–99)

## 2015-06-23 SURGERY — PHACOEMULSIFICATION, CATARACT, WITH IOL INSERTION
Anesthesia: Monitor Anesthesia Care | Laterality: Left | Wound class: Clean

## 2015-06-23 MED ORDER — TIMOLOL MALEATE 0.5 % OP SOLN
OPHTHALMIC | Status: DC | PRN
  Administered 2015-06-23: 1 [drp] via OPHTHALMIC

## 2015-06-23 MED ORDER — NA HYALUR & NA CHOND-NA HYALUR 0.4-0.35 ML IO KIT
PACK | INTRAOCULAR | Status: DC | PRN
  Administered 2015-06-23: 1 mL via INTRAOCULAR

## 2015-06-23 MED ORDER — FENTANYL CITRATE (PF) 100 MCG/2ML IJ SOLN
INTRAMUSCULAR | Status: DC | PRN
  Administered 2015-06-23: 50 ug via INTRAVENOUS

## 2015-06-23 MED ORDER — ARMC OPHTHALMIC DILATING GEL
1.0000 "application " | OPHTHALMIC | Status: DC | PRN
  Administered 2015-06-23 (×2): 1 via OPHTHALMIC

## 2015-06-23 MED ORDER — ACETAMINOPHEN 160 MG/5ML PO SOLN: 325.0000 mg | ORAL | Status: DC | PRN

## 2015-06-23 MED ORDER — BALANCED SALT IO SOLN
INTRAOCULAR | Status: DC | PRN
  Administered 2015-06-23: 1 mL via OPHTHALMIC

## 2015-06-23 MED ORDER — ONDANSETRON HCL 4 MG/2ML IJ SOLN: 4.0000 mg | Freq: Once | INTRAMUSCULAR | Status: DC | PRN

## 2015-06-23 MED ORDER — BRIMONIDINE TARTRATE 0.2 % OP SOLN
OPHTHALMIC | Status: DC | PRN
  Administered 2015-06-23: 1 [drp] via OPHTHALMIC

## 2015-06-23 MED ORDER — POVIDONE-IODINE 5 % OP SOLN
1.0000 "application " | OPHTHALMIC | Status: DC | PRN
  Administered 2015-06-23: 1 via OPHTHALMIC

## 2015-06-23 MED ORDER — CEFUROXIME OPHTHALMIC INJECTION 1 MG/0.1 ML
INJECTION | OPHTHALMIC | Status: DC | PRN
  Administered 2015-06-23: 0.1 mL via OPHTHALMIC

## 2015-06-23 MED ORDER — MIDAZOLAM HCL 2 MG/2ML IJ SOLN
INTRAMUSCULAR | Status: DC | PRN
  Administered 2015-06-23: 2 mg via INTRAVENOUS

## 2015-06-23 MED ORDER — EPINEPHRINE HCL 1 MG/ML IJ SOLN
INTRAMUSCULAR | Status: DC | PRN
  Administered 2015-06-23: 80 mL via OPHTHALMIC

## 2015-06-23 MED ORDER — ACETAMINOPHEN 325 MG PO TABS: 325.0000 mg | ORAL_TABLET | ORAL | Status: DC | PRN

## 2015-06-23 MED ORDER — TETRACAINE HCL 0.5 % OP SOLN
1.0000 [drp] | OPHTHALMIC | Status: DC | PRN
  Administered 2015-06-23: 1 [drp] via OPHTHALMIC

## 2015-06-23 SURGICAL SUPPLY — 29 items
APPLICATOR COTTON TIP 3IN (MISCELLANEOUS) ×3 IMPLANT
CANNULA ANT/CHMB 27GA (MISCELLANEOUS) ×3 IMPLANT
DISSECTOR HYDRO NUCLEUS 50X22 (MISCELLANEOUS) ×3 IMPLANT
GLOVE BIO SURGEON STRL SZ7 (GLOVE) ×3 IMPLANT
GLOVE SURG LX 6.5 MICRO (GLOVE) ×2
GLOVE SURG LX STRL 6.5 MICRO (GLOVE) ×1 IMPLANT
GOWN STRL REUS W/ TWL LRG LVL3 (GOWN DISPOSABLE) ×2 IMPLANT
GOWN STRL REUS W/TWL LRG LVL3 (GOWN DISPOSABLE) ×4
LENS IOL ACRSF IQ ULTRA 18.5 (Intraocular Lens) ×1 IMPLANT
LENS IOL ACRYSOF IQ 18.5 (Intraocular Lens) ×3 IMPLANT
MARKER SKIN DUAL TIP RULER LAB (MISCELLANEOUS) ×3 IMPLANT
NEEDLE FILTER BLUNT 18X 1/2SAF (NEEDLE) ×2
NEEDLE FILTER BLUNT 18X1 1/2 (NEEDLE) ×1 IMPLANT
PACK CATARACT BRASINGTON (MISCELLANEOUS) ×3 IMPLANT
PACK EYE AFTER SURG (MISCELLANEOUS) ×3 IMPLANT
PACK OPTHALMIC (MISCELLANEOUS) ×3 IMPLANT
RING MALYGIN 7.0 (MISCELLANEOUS) IMPLANT
SOL BAL SALT 15ML (MISCELLANEOUS)
SOLUTION BAL SALT 15ML (MISCELLANEOUS) IMPLANT
SUT ETHILON 10-0 CS-B-6CS-B-6 (SUTURE)
SUT VICRYL  9 0 (SUTURE)
SUT VICRYL 9 0 (SUTURE) IMPLANT
SUTURE EHLN 10-0 CS-B-6CS-B-6 (SUTURE) IMPLANT
SYR 3ML LL SCALE MARK (SYRINGE) ×3 IMPLANT
SYR TB 1ML LUER SLIP (SYRINGE) ×3 IMPLANT
WATER STERILE IRR 250ML POUR (IV SOLUTION) ×3 IMPLANT
WATER STERILE IRR 500ML POUR (IV SOLUTION) IMPLANT
WICK EYE OCUCEL (MISCELLANEOUS) IMPLANT
WIPE NON LINTING 3.25X3.25 (MISCELLANEOUS) ×3 IMPLANT

## 2015-06-23 NOTE — Op Note (Signed)
Date of Surgery: 06/23/2015  PREOPERATIVE DIAGNOSES: Visually significant nuclear sclerotic cataract, left eye.  POSTOPERATIVE DIAGNOSES: Same  PROCEDURES PERFORMED: Cataract extraction with intraocular lens implant, left eye.  SURGEON: Devin GoingAnita P. Vin, M.D.  ANESTHESIA: MAC and topical  IMPLANTS: AcrySof IQ [id number] +18.5 delivered via AU00T0 injector  Implant Name Type Inv. Item Serial No. Manufacturer Lot No. LRB No. Used  LENS IOL ACRYSOF IQ 18.5 - [id number] Intraocular Lens LENS IOL ACRYSOF IQ 18.5 [id number] ALCON   Left 1    COMPLICATIONS: None.  DESCRIPTION OF PROCEDURE: Therapeutic options were discussed with the patient preoperatively, including a discussion of risks and benefits of surgery. Informed consent was obtained. An IOL-Master and immersion biometry were used to take the lens measurements, and a dilated fundus exam was performed within 6 months of the surgical date.  The patient was premedicated and brought to the operating room and placed on the operating table in the supine position. After adequate anesthesia, the patient was prepped and draped in the usual sterile ophthalmic fashion. A wire lid speculum was inserted and the microscope was positioned. A Superblade was used to create a paracentesis site at the limbus and a small amount of dilute preservative free lidocaine was instilled into the anterior chamber, followed by dispersive viscoelastic. A clear corneal incision was created temporally using a 2.4 mm keratome blade. Capsulorrhexis was then performed. In situ phacoemulsification was performed.  Cortical material was removed with the irrigation-aspiration unit. Dispersive viscoelastic was instilled to open the capsular bag. A posterior chamber intraocular lens with the specifications above was inserted and positioned. Irrigation-aspiration was used to remove all viscoelastic. Cefuroxime 1cc was instilled into the anterior chamber, and the corneal incision was  checked and found to be water tight. The eyelid speculum was removed.  The operative eye was covered with protective goggles after instilling 1 drop of timolol and brimonidine. The patient tolerated the procedure well. There were no complications.

## 2015-06-23 NOTE — Transfer of Care (Signed)
Immediate Anesthesia Transfer of Care Note  Patient: Dana Willis  Procedure(s) Performed: Procedure(s) with comments: CATARACT EXTRACTION PHACO AND INTRAOCULAR LENS PLACEMENT (IOC) left eye (Left) - DIABETIC - oral meds  Patient Location: PACU  Anesthesia Type: MAC  Level of Consciousness: awake, alert  and patient cooperative  Airway and Oxygen Therapy: Patient Spontanous Breathing and Patient connected to supplemental oxygen  Post-op Assessment: Post-op Vital signs reviewed, Patient's Cardiovascular Status Stable, Respiratory Function Stable, Patent Airway and No signs of Nausea or vomiting  Post-op Vital Signs: Reviewed and stable  Complications: No apparent anesthesia complications

## 2015-06-23 NOTE — Anesthesia Preprocedure Evaluation (Signed)
Anesthesia Evaluation    History of Anesthesia Complications (+) PONV and history of anesthetic complications  Airway Mallampati: II  TM Distance: >3 FB Neck ROM: Full    Dental no notable dental hx.    Pulmonary sleep apnea , former smoker,    Pulmonary exam normal breath sounds clear to auscultation       Cardiovascular hypertension, Normal cardiovascular exam Rhythm:Regular Rate:Normal     Neuro/Psych PSYCHIATRIC DISORDERS Anxiety Depression  Neuromuscular disease    GI/Hepatic GERD  ,  Endo/Other  diabetes  Renal/GU      Musculoskeletal  (+) Arthritis ,   Abdominal   Peds  Hematology   Anesthesia Other Findings   Reproductive/Obstetrics                             Anesthesia Physical  Anesthesia Plan  ASA: III  Anesthesia Plan: MAC   Post-op Pain Management:    Induction: Intravenous  Airway Management Planned:   Additional Equipment:   Intra-op Plan:   Post-operative Plan: Extubation in OR  Informed Consent: I have reviewed the patients History and Physical, chart, labs and discussed the procedure including the risks, benefits and alternatives for the proposed anesthesia with the patient or authorized representative who has indicated his/her understanding and acceptance.   Dental advisory given  Plan Discussed with: CRNA  Anesthesia Plan Comments:         Anesthesia Quick Evaluation

## 2015-06-23 NOTE — Anesthesia Procedure Notes (Signed)
Procedure Name: MAC Performed by: Bobak Oguinn Pre-anesthesia Checklist: Patient identified, Emergency Drugs available, Suction available, Timeout performed and Patient being monitored Patient Re-evaluated:Patient Re-evaluated prior to inductionOxygen Delivery Method: Nasal cannula Placement Confirmation: positive ETCO2     

## 2015-06-23 NOTE — Anesthesia Postprocedure Evaluation (Signed)
Anesthesia Post Note  Patient: Dana Willis  Procedure(s) Performed: Procedure(s) (LRB): CATARACT EXTRACTION PHACO AND INTRAOCULAR LENS PLACEMENT (IOC) left eye (Left)  Patient location during evaluation: PACU Anesthesia Type: MAC Level of consciousness: awake and alert Pain management: pain level controlled Vital Signs Assessment: post-procedure vital signs reviewed and stable Respiratory status: spontaneous breathing, nonlabored ventilation, respiratory function stable and patient connected to nasal cannula oxygen Cardiovascular status: stable and blood pressure returned to baseline Anesthetic complications: no    Durene Fruitshomas,  Jada Fass G

## 2015-06-23 NOTE — H&P (Signed)
H+P reviewed and is up to date, please see paper chart.  

## 2015-06-24 ENCOUNTER — Encounter: Payer: Self-pay | Admitting: Ophthalmology

## 2015-09-11 ENCOUNTER — Other Ambulatory Visit: Payer: Self-pay | Admitting: Neurosurgery

## 2015-09-11 DIAGNOSIS — G919 Hydrocephalus, unspecified: Secondary | ICD-10-CM

## 2015-09-22 ENCOUNTER — Ambulatory Visit
Admission: RE | Admit: 2015-09-22 | Discharge: 2015-09-22 | Disposition: A | Discharge status: Home / Self Care | Payer: Managed Care, Other (non HMO) | Source: Ambulatory Visit | Attending: Neurosurgery | Admitting: Neurosurgery

## 2015-09-22 DIAGNOSIS — G919 Hydrocephalus, unspecified: Secondary | ICD-10-CM | POA: Diagnosis present

## 2015-11-26 ENCOUNTER — Other Ambulatory Visit: Payer: Self-pay | Admitting: Obstetrics and Gynecology

## 2015-11-26 DIAGNOSIS — Z1231 Encounter for screening mammogram for malignant neoplasm of breast: Secondary | ICD-10-CM

## 2015-12-09 ENCOUNTER — Ambulatory Visit
Admission: RE | Admit: 2015-12-09 | Discharge: 2015-12-09 | Disposition: A | Discharge status: Home / Self Care | Payer: Managed Care, Other (non HMO) | Source: Ambulatory Visit | Attending: Obstetrics and Gynecology | Admitting: Obstetrics and Gynecology

## 2015-12-09 DIAGNOSIS — Z1231 Encounter for screening mammogram for malignant neoplasm of breast: Secondary | ICD-10-CM | POA: Insufficient documentation

## 2015-12-22 ENCOUNTER — Other Ambulatory Visit: Payer: Self-pay | Admitting: *Deleted

## 2015-12-22 ENCOUNTER — Inpatient Hospital Stay
Admission: RE | Admit: 2015-12-22 | Discharge: 2015-12-22 | Disposition: A | Discharge status: Home / Self Care | Payer: Self-pay | Source: Ambulatory Visit | Attending: *Deleted | Admitting: *Deleted

## 2015-12-22 DIAGNOSIS — Z9289 Personal history of other medical treatment: Secondary | ICD-10-CM

## 2016-03-03 DIAGNOSIS — G912 (Idiopathic) normal pressure hydrocephalus: Secondary | ICD-10-CM | POA: Insufficient documentation

## 2016-03-03 DIAGNOSIS — K219 Gastro-esophageal reflux disease without esophagitis: Secondary | ICD-10-CM | POA: Insufficient documentation

## 2016-03-03 DIAGNOSIS — I10 Essential (primary) hypertension: Secondary | ICD-10-CM | POA: Insufficient documentation

## 2016-03-03 DIAGNOSIS — E7800 Pure hypercholesterolemia, unspecified: Secondary | ICD-10-CM | POA: Insufficient documentation

## 2016-03-03 DIAGNOSIS — B009 Herpesviral infection, unspecified: Secondary | ICD-10-CM | POA: Insufficient documentation

## 2016-11-30 ENCOUNTER — Other Ambulatory Visit: Payer: Self-pay | Admitting: Obstetrics and Gynecology

## 2016-11-30 DIAGNOSIS — Z1231 Encounter for screening mammogram for malignant neoplasm of breast: Secondary | ICD-10-CM

## 2016-12-21 ENCOUNTER — Other Ambulatory Visit: Payer: Self-pay | Admitting: Obstetrics and Gynecology

## 2016-12-21 ENCOUNTER — Ambulatory Visit
Admission: RE | Admit: 2016-12-21 | Discharge: 2016-12-21 | Disposition: A | Discharge status: Home / Self Care | Payer: 59 | Source: Ambulatory Visit | Attending: Obstetrics and Gynecology | Admitting: Obstetrics and Gynecology

## 2016-12-21 DIAGNOSIS — Z1231 Encounter for screening mammogram for malignant neoplasm of breast: Secondary | ICD-10-CM | POA: Diagnosis not present

## 2017-12-06 ENCOUNTER — Other Ambulatory Visit: Payer: Self-pay | Admitting: Obstetrics and Gynecology

## 2017-12-06 DIAGNOSIS — Z1231 Encounter for screening mammogram for malignant neoplasm of breast: Secondary | ICD-10-CM

## 2017-12-27 ENCOUNTER — Ambulatory Visit
Admission: RE | Admit: 2017-12-27 | Discharge: 2017-12-27 | Disposition: A | Discharge status: Home / Self Care | Payer: 59 | Source: Ambulatory Visit | Attending: Obstetrics and Gynecology | Admitting: Obstetrics and Gynecology

## 2017-12-27 DIAGNOSIS — Z1231 Encounter for screening mammogram for malignant neoplasm of breast: Secondary | ICD-10-CM | POA: Diagnosis not present

## 2018-03-08 DIAGNOSIS — E781 Pure hyperglyceridemia: Secondary | ICD-10-CM | POA: Insufficient documentation

## 2018-03-08 DIAGNOSIS — Z794 Long term (current) use of insulin: Secondary | ICD-10-CM | POA: Insufficient documentation

## 2018-03-08 DIAGNOSIS — E1169 Type 2 diabetes mellitus with other specified complication: Secondary | ICD-10-CM | POA: Insufficient documentation

## 2018-03-08 DIAGNOSIS — E1142 Type 2 diabetes mellitus with diabetic polyneuropathy: Secondary | ICD-10-CM | POA: Insufficient documentation

## 2019-10-23 ENCOUNTER — Other Ambulatory Visit: Payer: Self-pay | Admitting: Certified Nurse Midwife

## 2019-10-23 DIAGNOSIS — Z1231 Encounter for screening mammogram for malignant neoplasm of breast: Secondary | ICD-10-CM

## 2019-10-25 ENCOUNTER — Other Ambulatory Visit: Payer: Self-pay | Admitting: Certified Nurse Midwife

## 2019-10-25 DIAGNOSIS — Z780 Asymptomatic menopausal state: Secondary | ICD-10-CM

## 2019-10-25 DIAGNOSIS — Z13820 Encounter for screening for osteoporosis: Secondary | ICD-10-CM

## 2019-10-25 DIAGNOSIS — Z01419 Encounter for gynecological examination (general) (routine) without abnormal findings: Secondary | ICD-10-CM

## 2019-11-01 ENCOUNTER — Ambulatory Visit
Admission: RE | Admit: 2019-11-01 | Discharge: 2019-11-01 | Disposition: A | Discharge status: Home / Self Care | Payer: Medicare Other | Source: Ambulatory Visit | Attending: Certified Nurse Midwife | Admitting: Certified Nurse Midwife

## 2019-11-01 ENCOUNTER — Other Ambulatory Visit: Payer: Self-pay

## 2019-11-01 DIAGNOSIS — Z1231 Encounter for screening mammogram for malignant neoplasm of breast: Secondary | ICD-10-CM | POA: Diagnosis present

## 2020-07-18 NOTE — Progress Notes (Signed)
[facility] [address] [place], [place] [address]  Pulmonary Sleep Medicine   Office Visit Note  Patient Name: Dana Willis DOB: 1955/04/21 MRN [id number]    Chief Complaint: Obstructive Sleep Apnea visit  Brief History:  Dana Willis is seen today for initial consult to establish PAP therapy care. The patient has a 10+ history of sleep apnea with earlier symptoms of GERD, hypertension & not feeling well rested when she awakened in the mornings. Patient is using APAP nightly @ 6-15cmH2O.  The patient feels well rested after sleeping with PAP.  The patient reports improvement from PAP use. Reported sleepiness is  corrected and the Epworth Sleepiness Score is 3 out of 24. The patient never takes naps. The patient complains of the following: needing Dana supplies so discussed regula r replacements shipped monthly The compliance download shows  compliance with an average use time of 7:59 hours @ 100%.  We are faxing previous PCP/DME provider to obtain original diagnostic PSG.The AHI is 0.8  The patient does not complain of limb movements disrupting sleep.  ROS  General: (-) fever, (-) chills, (-) night sweat Nose and Sinuses: (-) nasal stuffiness or itchiness, (-) postnasal drip, (-) nosebleeds, (-) sinus trouble. Mouth and Throat: (-) sore throat, (-) hoarseness. Neck: (-) swollen glands, (-) enlarged thyroid, (-) neck pain. Respiratory: - cough, - shortness of breath, - wheezing. Neurologic: + numbness, + tingling known diabetic neuropathy Psychiatric: + anxiety, + depression   Current Medication: Outpatient Encounter Medications as of 07/21/2020  Medication Sig   acyclovir (ZOVIRAX) 400 MG tablet Take 1 tablet by mouth 2 (two) times daily.   ALPRAZolam (XANAX) 0.25 MG tablet Take by mouth.   dicyclomine (BENTYL) 10 MG capsule Take by mouth.   doxazosin (CARDURA) 4 MG tablet Take 1 tablet by mouth at bedtime.   hydrochlorothiazide (HYDRODIURIL) 25 MG tablet Take 1 tablet by  mouth daily.   icosapent Ethyl (VASCEPA) 1 g capsule Take by mouth.   metFORMIN (GLUCOPHAGE) 1000 MG tablet Take 1 tablet by mouth daily.   metoprolol tartrate (LOPRESSOR) 50 MG tablet Take 1 tablet by mouth daily.   PARoxetine (PAXIL) 10 MG tablet Take 1 tablet by mouth daily.   pregabalin (LYRICA) 200 MG capsule Take 1 capsule by mouth 2 (two) times daily.   quinapril (ACCUPRIL) 20 MG tablet Take 1 tablet by mouth daily.   rosuvastatin (CRESTOR) 20 MG tablet Take 1 tablet by mouth daily.   acyclovir (ZOVIRAX) 400 MG tablet Take 400 mg by mouth 2 (two) times daily.   ALPRAZolam (XANAX) 0.25 MG tablet Take 0.25 mg by mouth as needed for anxiety.   Ascorbic Acid (VITAMIN C) 1000 MG tablet Take 1,000 mg by mouth daily.   aspirin 81 MG EC tablet Take by mouth.   aspirin 81 MG tablet Take 81 mg by mouth daily.   Calcium Carbonate (CALCIUM 600 PO) Take by mouth 2 (two) times daily.   Calcium Carbonate-Vitamin D 600-200 MG-UNIT TABS Take by mouth.   Cholecalciferol (VITAMIN D-3) 1000 units CAPS Take by mouth daily.   Cholecalciferol 25 MCG (1000 UT) tablet Take by mouth.   doxazosin (CARDURA) 4 MG tablet Take 4 mg by mouth daily.   ELDERBERRY PO Take 550 mg by mouth 2 (two) times daily.   FARXIGA 10 MG TABS tablet Take 10 mg by mouth daily.   hydrochlorothiazide (HYDRODIURIL) 25 MG tablet Take 25 mg by mouth daily.   metoprolol (LOPRESSOR) 50 MG tablet Take 50 mg by mouth  daily.   Multiple Vitamins-Minerals (MULTIVITAMIN ADULT PO) Take by mouth daily.   omega-3 acid ethyl esters (LOVAZA) 1 g capsule Take by mouth 2 (two) times daily.   pantoprazole (PROTONIX) 40 MG tablet Take 40 mg by mouth daily.   PARoxetine (PAXIL) 10 MG tablet Take 10 mg by mouth at bedtime.   pregabalin (LYRICA) 200 MG capsule Take 200 mg by mouth 2 (two) times daily.   Probiotic, Lactobacillus, CAPS Take by mouth.   quinapril (ACCUPRIL) 20 MG tablet Take 20 mg by mouth daily.   rosuvastatin (CRESTOR) 20 MG tablet Take  20 mg by mouth daily.   sitaGLIPtin-metformin (JANUMET) 50-500 MG tablet Take 1 tablet by mouth 2 (two) times daily with a meal.   TRESIBA FLEXTOUCH 200 UNIT/ML FlexTouch Pen Inject into the skin.   [DISCONTINUED] empagliflozin (JARDIANCE) 25 MG TABS tablet Take 25 mg by mouth daily.   No facility-administered encounter medications on file as of 07/21/2020.    Surgical History: Past Surgical History:  Procedure Laterality Date   BRAIN SURGERY  2008   Shunt   CATARACT EXTRACTION W/PHACO Right 05/19/2015   Procedure: CATARACT EXTRACTION PHACO AND INTRAOCULAR LENS PLACEMENT (IOC) right eye;  Surgeon: Sherald Hess, MD;  Location: [facility];  Service: Ophthalmology;  Laterality: Right;  DIABETIC - oral meds CPAP LEAVE PT 1ST   CATARACT EXTRACTION W/PHACO Left 06/23/2015   Procedure: CATARACT EXTRACTION PHACO AND INTRAOCULAR LENS PLACEMENT (IOC) left eye;  Surgeon: Sherald Hess, MD;  Location: [facility];  Service: Ophthalmology;  Laterality: Left;  DIABETIC - oral meds   CESAREAN SECTION     CHOLECYSTECTOMY     HERNIA REPAIR     TUBAL LIGATION      Medical History: Past Medical History:  Diagnosis Date   Anxiety    panic attacks   Depression    Diabetes mellitus without complication (HCC)    GERD (gastroesophageal reflux disease)    Hydrocephalus (HCC)    normal pressure   Hypertension    Lumbar disc disorder    compression   Motion sickness    any moving vehicle   Neuropathy of both feet    Pt attributes to DM   PONV (postoperative nausea and vomiting)    Right rotator cuff tear    Sleep apnea    CPAP   Vertigo    daily    Family History: Non contributory to the present illness  Social History: Social History   Socioeconomic History   Marital status: Married    Spouse name: Not on file   Number of children: Not on file   Years of education: Not on file   Highest education level: Not on file  Occupational History   Not  on file  Tobacco Use   Smoking status: Former    Pack years: 0.00    Types: Cigarettes    Quit date: 02/02/1983    Years since quitting: 37.4   Smokeless tobacco: Never  Substance and Sexual Activity   Alcohol use: Yes    Alcohol/week: 1.0 standard drink    Types: 1 Glasses of wine per week   Drug use: Not on file   Sexual activity: Not on file  Other Topics Concern   Not on file  Social History Narrative   Not on file   Social Determinants of Health   Financial Resource Strain: Not on file  Food Insecurity: Not on file  Transportation Needs: Not on file  Physical Activity:  Not on file  Stress: Not on file  Social Connections: Not on file  Intimate Partner Violence: Not on file    Vital Signs: Blood pressure 133/62, pulse (!) 56, resp. rate 16, height 5' (1.524 m), weight 186 lb (84.4 kg), SpO2 96 %.  Examination: General Appearance: The patient is well-developed, well-nourished, and in no distress. Neck Circumference: 41 Skin: Gross inspection of skin unremarkable. Head: normocephalic, no gross deformities. Eyes: no gross deformities noted. ENT: ears appear grossly normal Neurologic: Alert and oriented. No involuntary movements.    EPWORTH SLEEPINESS SCALE:  Scale:  (0)= no chance of dozing; (1)= slight chance of dozing; (2)= moderate chance of dozing; (3)= high chance of dozing  Chance  Situtation    Sitting and reading: 1    Watching TV: 1    Sitting Inactive in public: 0    As a passenger in car: 0      Lying down to rest: 1    Sitting and talking: 0    Sitting quielty after lunch: 0    In a car, stopped in traffic: 0   TOTAL SCORE:   3 out of 24    SLEEP STUDIES:  No PSG on file, only Titration study, (03/16/17)controlled in lateral  @ 6CmH2O; controlled supine  @14cmH2O   Recommend APAP @ 6-16 cmH2O.   CPAP COMPLIANCE DATA:  Date Range: 07/18/19 - 07/16/20  Average Daily Use: 7:59 hours  Median Use: 8:08 hours  Compliance for > 4  Hours: 100% days  AHI: 0.8  respiratory events per hour  Days Used: 365/365 days  Mask Leak: 18.6 Lpm  95th Percentile Pressure: 14.6 cmH2O         LABS: No results found for this or any previous visit (from the past 2160 hour(s)).  Radiology: MM 3D SCREEN BREAST BILATERAL  Result Date: 11/02/2019 CLINICAL DATA:  Screening. EXAM: DIGITAL SCREENING BILATERAL MAMMOGRAM WITH TOMO AND CAD COMPARISON:  Previous exam(s). ACR Breast Density Category a: The breast tissue is almost entirely fatty. FINDINGS: There are no findings suspicious for malignancy. Images were processed with CAD. IMPRESSION: No mammographic evidence of malignancy. A result letter of this screening mammogram will be mailed directly to the patient. RECOMMENDATION: Screening mammogram in one year. (Code:SM-B-01Y) BI-RADS CATEGORY  1: Negative. Electronically Signed   By: 01/02/2020 M.D.   On: 11/02/2019 07:59    No results found.  No results found.    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 OSA on CPAP The patient does tolerate PAP and reports definite benefit from PAP use. The patient was reminded how to clean equipment and advised to replace supplies routinely. The patient was also counselled on weight loss. The compliance is excellent. The AHI is 0.8.  OSA- continue excellent compliance.    2. CPAP use counseling CPAP Counseling: had a lengthy  discussion with the patient regarding the importance of PAP therapy in management of the sleep apnea. Patient appears to understand the risk  factor reduction and also understands the risks associated with untreated sleep apnea. Patient will try to make a good faith effort to remain compliant with therapy. Also instructed the patient on proper cleaning of the device including the water must be changed daily if possible and use of distilled water is preferred. Patient understands that the machine should be regularly cleaned with appropriate recommended cleaning solutions that do not damage the PAP machine for example given white vinegar and water rinses. Other methods such as ozone treatment may not be as good as these simple methods to achieve cleaning.   3. Obesity (BMI 30-39.9) Obesity Counseling: Had a lengthy discussion regarding patients BMI and weight issues. Patient was instructed on portion control as well as increased activity. Also discussed caloric restrictions with trying to maintain intake less than 2000 Kcal. Discussions were made in accordance with the 5As of weight management. Simple actions such as not eating late and if able to, taking a walk is suggested.   4. Essential hypertension Hypertension Counseling:   The following hypertensive lifestyle modification were recommended and discussed:  1. Limiting alcohol intake to less than 1 oz/day of ethanol:(24 oz of beer or 8 oz of wine or 2 oz of 100-proof whiskey). 2. Take baby ASA 81 mg daily. 3. Importance of regular aerobic exercise and losing weight. 4. Reduce dietary saturated fat and cholesterol intake for overall cardiovascular health. 5. Maintaining adequate dietary potassium, calcium, and magnesium intake. 6. Regular monitoring of the blood pressure. 7. Reduce sodium intake to less than 100 mmol/day (less than 2.3 gm of sodium or less than 6 gm of sodium choride)    5. Gastroesophageal reflux disease without  esophagitis Controlled with ppi. Avoid lifestyle triggers.    General Counseling: I have discussed the findings of the evaluation and examination with Dana Willis.  I have also discussed any further diagnostic evaluation thatmay be needed or ordered today. Dana Willis verbalizes understanding of the findings of todays visit. We also reviewed her medications today and discussed drug interactions and side effects including but not limited excessive drowsiness and altered mental states. We also discussed that there is always a risk not just to her but also people around her. she has been encouraged to call the office with any questions or concerns that should arise related to todays visit.  No orders of the defined types were placed in this encounter.       I have personally obtained a history, examined the patient, evaluated laboratory and imaging results, formulated the assessment and plan and placed orders.   This patient was seen today by Emmaline KluverSarah Terrell, PA-C in collaboration with Dr. Freda MunroSaadat Shaylene Paganelli.      Dana PaxSaadat A Yohann Curl, MD [facility] CenterFCCP Diplomate ABMS Pulmonary and Critical Care Medicine Sleep medicine

## 2020-07-21 ENCOUNTER — Ambulatory Visit (INDEPENDENT_AMBULATORY_CARE_PROVIDER_SITE_OTHER): Payer: Medicare Other | Admitting: Internal Medicine

## 2020-07-21 VITALS — BP 133/62 | HR 56 | Resp 16 | Ht 60.0 in | Wt 186.0 lb

## 2020-07-21 DIAGNOSIS — K219 Gastro-esophageal reflux disease without esophagitis: Secondary | ICD-10-CM

## 2020-07-21 DIAGNOSIS — I10 Essential (primary) hypertension: Secondary | ICD-10-CM

## 2020-07-21 DIAGNOSIS — E669 Obesity, unspecified: Secondary | ICD-10-CM | POA: Diagnosis not present

## 2020-07-21 DIAGNOSIS — Z9989 Dependence on other enabling machines and devices: Secondary | ICD-10-CM

## 2020-07-21 DIAGNOSIS — G4733 Obstructive sleep apnea (adult) (pediatric): Secondary | ICD-10-CM | POA: Diagnosis not present

## 2020-07-21 DIAGNOSIS — Z7189 Other specified counseling: Secondary | ICD-10-CM | POA: Diagnosis not present

## 2020-07-21 NOTE — Patient Instructions (Signed)

## 2021-05-21 ENCOUNTER — Other Ambulatory Visit: Payer: Self-pay | Admitting: Neurosurgery

## 2021-05-21 DIAGNOSIS — G912 (Idiopathic) normal pressure hydrocephalus: Secondary | ICD-10-CM

## 2021-05-21 DIAGNOSIS — Z982 Presence of cerebrospinal fluid drainage device: Secondary | ICD-10-CM

## 2021-05-29 ENCOUNTER — Ambulatory Visit
Admission: RE | Admit: 2021-05-29 | Discharge: 2021-05-29 | Disposition: A | Discharge status: Home / Self Care | Payer: Medicare Other | Source: Ambulatory Visit | Attending: Neurosurgery | Admitting: Neurosurgery

## 2021-05-29 DIAGNOSIS — G912 (Idiopathic) normal pressure hydrocephalus: Secondary | ICD-10-CM

## 2021-05-29 DIAGNOSIS — Z982 Presence of cerebrospinal fluid drainage device: Secondary | ICD-10-CM

## 2021-08-23 NOTE — Progress Notes (Unsigned)
[facility] [address] [place], [place] [address]  Pulmonary Sleep Medicine   Office Visit Note  Patient Name: Afnan Cadiente DOB: Sep 06, 1955 MRN [id number]    Chief Complaint: Obstructive Sleep Apnea visit  Brief History:  Vielka is seen today for her annual follow up on Auto CPAP 6-15cmh20.  The patient has a 11 plus year  history of sleep apnea. Patient is using PAP nightly.  The patient feels rested after sleeping with PAP.  The patient reports benefiting from PAP use. Patient continues to require PAP therapy in order to eliminate sleep apnea. Reported sleepiness is improved and the Epworth Sleepiness Score is 2 out of 24. The patient does not take naps. The patient complains of the following: None.  The compliance download shows 99% compliance with an average use time of 7 hours 59 min. The AHI is 0.9  The patient does not complain of limb movements disrupting sleep.  ROS  General: (-) fever, (-) chills, (-) night sweat Nose and Sinuses: (-) nasal stuffiness or itchiness, (-) postnasal drip, (-) nosebleeds, (-) sinus trouble. Mouth and Throat: (-) sore throat, (-) hoarseness. Neck: (-) swollen glands, (-) enlarged thyroid, (-) neck pain. Respiratory: - cough, - shortness of breath, - wheezing. Neurologic: + numbness, + tingling. Psychiatric: + anxiety, - depression   Current Medication: Outpatient Encounter Medications as of 08/24/2021  Medication Sig   acyclovir (ZOVIRAX) 400 MG tablet Take 400 mg by mouth 2 (two) times daily.   acyclovir (ZOVIRAX) 400 MG tablet Take 1 tablet by mouth 2 (two) times daily.   ALPRAZolam (XANAX) 0.25 MG tablet Take 0.25 mg by mouth as needed for anxiety.   ALPRAZolam (XANAX) 0.25 MG tablet Take by mouth.   Ascorbic Acid (VITAMIN C) 1000 MG tablet Take 1,000 mg by mouth daily.   aspirin 81 MG EC tablet Take by mouth.   aspirin 81 MG tablet Take 81 mg by mouth daily.   Calcium Carbonate (CALCIUM 600 PO) Take by mouth 2 (two)  times daily.   Calcium Carbonate-Vitamin D 600-200 MG-UNIT TABS Take by mouth.   Cholecalciferol (VITAMIN D-3) 1000 units CAPS Take by mouth daily.   Cholecalciferol 25 MCG (1000 UT) tablet Take by mouth.   dicyclomine (BENTYL) 10 MG capsule Take by mouth.   doxazosin (CARDURA) 4 MG tablet Take 4 mg by mouth daily.   doxazosin (CARDURA) 4 MG tablet Take 1 tablet by mouth at bedtime.   ELDERBERRY PO Take 550 mg by mouth 2 (two) times daily.   FARXIGA 10 MG TABS tablet Take 10 mg by mouth daily.   hydrochlorothiazide (HYDRODIURIL) 25 MG tablet Take 25 mg by mouth daily.   hydrochlorothiazide (HYDRODIURIL) 25 MG tablet Take 1 tablet by mouth daily.   icosapent Ethyl (VASCEPA) 1 g capsule Take by mouth.   metFORMIN (GLUCOPHAGE) 1000 MG tablet Take 1 tablet by mouth daily.   metoprolol (LOPRESSOR) 50 MG tablet Take 50 mg by mouth daily.   metoprolol tartrate (LOPRESSOR) 50 MG tablet Take 1 tablet by mouth daily.   Multiple Vitamins-Minerals (MULTIVITAMIN ADULT PO) Take by mouth daily.   omega-3 acid ethyl esters (LOVAZA) 1 g capsule Take by mouth 2 (two) times daily.   pantoprazole (PROTONIX) 40 MG tablet Take 40 mg by mouth daily.   PARoxetine (PAXIL) 10 MG tablet Take 10 mg by mouth at bedtime.   PARoxetine (PAXIL) 10 MG tablet Take 1 tablet by mouth daily.   pregabalin (LYRICA) 200 MG capsule Take 200 mg by  mouth 2 (two) times daily.   pregabalin (LYRICA) 200 MG capsule Take 1 capsule by mouth 2 (two) times daily.   Probiotic, Lactobacillus, CAPS Take by mouth.   quinapril (ACCUPRIL) 20 MG tablet Take 20 mg by mouth daily.   quinapril (ACCUPRIL) 20 MG tablet Take 1 tablet by mouth daily.   rosuvastatin (CRESTOR) 20 MG tablet Take 20 mg by mouth daily.   rosuvastatin (CRESTOR) 20 MG tablet Take 1 tablet by mouth daily.   sitaGLIPtin-metformin (JANUMET) 50-500 MG tablet Take 1 tablet by mouth 2 (two) times daily with a meal.   TRESIBA FLEXTOUCH 200 UNIT/ML FlexTouch Pen Inject into the skin.    No facility-administered encounter medications on file as of 08/24/2021.    Surgical History: Past Surgical History:  Procedure Laterality Date   BRAIN SURGERY  2008   Shunt   CATARACT EXTRACTION W/PHACO Right 05/19/2015   Procedure: CATARACT EXTRACTION PHACO AND INTRAOCULAR LENS PLACEMENT (IOC) right eye;  Surgeon: Sherald Hess, MD;  Location: [facility];  Service: Ophthalmology;  Laterality: Right;  DIABETIC - oral meds CPAP LEAVE PT 1ST   CATARACT EXTRACTION W/PHACO Left 06/23/2015   Procedure: CATARACT EXTRACTION PHACO AND INTRAOCULAR LENS PLACEMENT (IOC) left eye;  Surgeon: Sherald Hess, MD;  Location: [facility];  Service: Ophthalmology;  Laterality: Left;  DIABETIC - oral meds   CESAREAN SECTION     CHOLECYSTECTOMY     HERNIA REPAIR     TUBAL LIGATION      Medical History: Past Medical History:  Diagnosis Date   Anxiety    panic attacks   Depression    Diabetes mellitus without complication (HCC)    GERD (gastroesophageal reflux disease)    Hydrocephalus (HCC)    normal pressure   Hypertension    Lumbar disc disorder    compression   Motion sickness    any moving vehicle   Neuropathy of both feet    Pt attributes to DM   PONV (postoperative nausea and vomiting)    Right rotator cuff tear    Sleep apnea    CPAP   Vertigo    daily    Family History: Non contributory to the present illness  Social History: Social History   Socioeconomic History   Marital status: Married    Spouse name: Not on file   Number of children: Not on file   Years of education: Not on file   Highest education level: Not on file  Occupational History   Not on file  Tobacco Use   Smoking status: Former    Types: Cigarettes    Quit date: 02/02/1983    Years since quitting: 38.5   Smokeless tobacco: Never  Substance and Sexual Activity   Alcohol use: Yes    Alcohol/week: 1.0 standard drink of alcohol    Types: 1 Glasses of wine per  week   Drug use: Not on file   Sexual activity: Not on file  Other Topics Concern   Not on file  Social History Narrative   Not on file   Social Determinants of Health   Financial Resource Strain: Not on file  Food Insecurity: Not on file  Transportation Needs: Not on file  Physical Activity: Not on file  Stress: Not on file  Social Connections: Not on file  Intimate Partner Violence: Not on file    Vital Signs: There were no vitals taken for this visit. There is no height or weight on file to calculate  BMI.    Examination: General Appearance: The patient is well-developed, well-nourished, and in no distress. Neck Circumference: 42 cm Skin: Gross inspection of skin unremarkable. Head: normocephalic, no gross deformities. Eyes: no gross deformities noted. ENT: ears appear grossly normal Neurologic: Alert and oriented. No involuntary movements.    EPWORTH SLEEPINESS SCALE:  Scale:  (0)= no chance of dozing; (1)= slight chance of dozing; (2)= moderate chance of dozing; (3)= high chance of dozing  Chance  Situtation    Sitting and reading: 1    Watching TV: 1    Sitting Inactive in public: 0    As a passenger in car: 0      Lying down to rest: 0    Sitting and talking: 0    Sitting quielty after lunch: 0    In a car, stopped in traffic: 0   TOTAL SCORE:   2 out of 24    SLEEP STUDIES:  03/16/17- CPAP titration (No PSG on file prior) Recommend APAP 6-16cmh20 08/14/20- PSG- Severe, AHI 80.9 lateral, min SP02 73% 3.   08/27/20- Titration- CPAP@ 14  CPAP COMPLIANCE DATA:  Date Range: 08/21/20-08/20/21  Average Daily Use: 7 hours 59 min  Median Use: 8 hrs 7 min   Compliance for > 4 Hours: 99% days  AHI: 0.9  respiratory events per hour  Days Used: 362/365  Mask Leak: 21.3  95th Percentile Pressure: 14.6         LABS: No results found for this or any previous visit (from the past 2160 hour(s)).  Radiology: CT HEAD WO CONTRAST  ( )  Result Date: 05/29/2021 CLINICAL DATA:  Normal pressure hydrocephalus, status post VP shunt, unsteady gait and dizziness EXAM: CT HEAD WITHOUT CONTRAST TECHNIQUE: Contiguous axial images were obtained from the base of the skull through the vertex without intravenous contrast. RADIATION DOSE REDUCTION: This exam was performed according to the departmental dose-optimization program which includes automated exposure control, adjustment of the mA and/or kV according to patient size and/or use of iterative reconstruction technique. COMPARISON:  09/22/2015 FINDINGS: Brain: Redemonstrated right frontal approach ventriculostomy shunt, with tip in the right lateral ventricle. Unchanged size and configuration of the ventricular system. No discontinuity in the imaged shunt tubing. No acute infarct, hemorrhage, mass mass effect, midline shift. No extra-axial collection. Vascular: No hyperdense vessel. Skull: Right frontal burr hole. Negative for fracture or focal lesion. Sinuses/Orbits: Clear paranasal sinuses. Status post bilateral lens replacements. Other: The mastoids are well aerated. IMPRESSION: 1. Unchanged position of right frontal approach ventriculostomy catheter in unchanged size and configuration of the ventricular system. 2.  No acute intracranial process. Electronically Signed   By: Wiliam Ke M.D.   On: 05/29/2021 21:49    No results found.  No results found.    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 OSA on CPAP The patient does tolerate PAP and reports  benefit from PAP use. The patient was reminded how to clean equipment and advised to replace supplies  more frequently. The patient was also counselled on weight loss. The compliance is excellent. The AHI is 0.9.   OSA on cpap- CPAP continues to be medically necessary to treat this patient's OSA.  Continue with excellent compliance. Change and clean supplies more frequently to minimize leak. F/u one year.    2. CPAP use counseling CPAP Counseling: had a lengthy discussion with the patient regarding the importance of PAP therapy in management of the sleep apnea. Patient appears to understand the risk factor reduction and also understands the risks associated with untreated sleep apnea. Patient will try to make a good faith effort to remain compliant with therapy. Also instructed the patient on proper cleaning of the device including the water must be changed daily if possible and use of distilled water is preferred. Patient understands that the machine should be regularly cleaned with appropriate recommended cleaning solutions that do not damage the PAP machine for example given white vinegar and water rinses. Other methods such as ozone treatment may not be as good as these simple methods to achieve cleaning.   3. Essential hypertension Hypertension Counseling:   The following hypertensive lifestyle modification were recommended and discussed:  1. Limiting alcohol intake to less than 1 oz/day of ethanol:(24 oz of beer or 8 oz of wine or 2 oz of 100-proof whiskey). 2. Take baby ASA 81 mg daily. 3. Importance of regular aerobic exercise and losing weight. 4. Reduce dietary saturated fat and cholesterol intake for overall cardiovascular health. 5. Maintaining adequate dietary potassium, calcium, and magnesium intake. 6. Regular monitoring of the blood pressure. 7. Reduce sodium intake  to less than 100 mmol/day (less than 2.3 gm of sodium or less than 6 gm of sodium choride)       General Counseling: I have discussed the findings of the evaluation and examination with Elnita Maxwell.  I have also discussed any further diagnostic evaluation thatmay be needed or ordered today. Terrianna verbalizes understanding of the findings of todays visit. We also reviewed her medications today and discussed drug interactions and side effects including but not limited excessive drowsiness and altered mental states. We also discussed that there is always a risk not just to her but also people around her. she has been encouraged to call the office with any questions or concerns that should arise related to todays visit.  No orders of the defined types were placed in this encounter.       I have personally obtained a history, examined the patient, evaluated laboratory and imaging results, formulated the assessment and plan and placed orders. This patient was seen today by Emmaline Kluver, PA-C in collaboration with Dr. Freda Munro.   Yevonne Pax, MD [facility] Diplomate ABMS Pulmonary Critical Care Medicine and Sleep Medicine

## 2021-08-24 ENCOUNTER — Ambulatory Visit (INDEPENDENT_AMBULATORY_CARE_PROVIDER_SITE_OTHER): Payer: Medicare Other | Admitting: Internal Medicine

## 2021-08-24 VITALS — BP 130/63 | HR 76 | Resp 18 | Ht 61.0 in | Wt 192.6 lb

## 2021-08-24 DIAGNOSIS — Z9989 Dependence on other enabling machines and devices: Secondary | ICD-10-CM | POA: Diagnosis not present

## 2021-08-24 DIAGNOSIS — Z7189 Other specified counseling: Secondary | ICD-10-CM | POA: Diagnosis not present

## 2021-08-24 DIAGNOSIS — I10 Essential (primary) hypertension: Secondary | ICD-10-CM | POA: Diagnosis not present

## 2021-08-24 DIAGNOSIS — G4733 Obstructive sleep apnea (adult) (pediatric): Secondary | ICD-10-CM | POA: Diagnosis not present

## 2021-08-24 NOTE — Patient Instructions (Signed)

## 2022-05-14 ENCOUNTER — Encounter: Payer: Self-pay | Admitting: *Deleted

## 2022-05-20 ENCOUNTER — Encounter: Payer: Self-pay | Admitting: *Deleted

## 2022-05-21 ENCOUNTER — Ambulatory Visit: Payer: Medicare Other | Admitting: Anesthesiology

## 2022-05-21 ENCOUNTER — Encounter
Admission: RE | Disposition: A | Discharge status: Home / Self Care | Payer: Self-pay | Source: Home / Self Care | Attending: Gastroenterology

## 2022-05-21 ENCOUNTER — Encounter: Payer: Self-pay | Admitting: *Deleted

## 2022-05-21 ENCOUNTER — Ambulatory Visit
Admission: RE | Admit: 2022-05-21 | Discharge: 2022-05-21 | Disposition: A | Discharge status: Home / Self Care | Payer: Medicare Other | Attending: Gastroenterology | Admitting: Gastroenterology

## 2022-05-21 DIAGNOSIS — Z1211 Encounter for screening for malignant neoplasm of colon: Secondary | ICD-10-CM | POA: Diagnosis present

## 2022-05-21 DIAGNOSIS — M1990 Unspecified osteoarthritis, unspecified site: Secondary | ICD-10-CM | POA: Diagnosis not present

## 2022-05-21 DIAGNOSIS — E669 Obesity, unspecified: Secondary | ICD-10-CM | POA: Diagnosis not present

## 2022-05-21 DIAGNOSIS — Z982 Presence of cerebrospinal fluid drainage device: Secondary | ICD-10-CM | POA: Diagnosis not present

## 2022-05-21 DIAGNOSIS — Z7985 Long-term (current) use of injectable non-insulin antidiabetic drugs: Secondary | ICD-10-CM | POA: Diagnosis not present

## 2022-05-21 DIAGNOSIS — E1140 Type 2 diabetes mellitus with diabetic neuropathy, unspecified: Secondary | ICD-10-CM | POA: Diagnosis not present

## 2022-05-21 DIAGNOSIS — Z6835 Body mass index (BMI) 35.0-35.9, adult: Secondary | ICD-10-CM | POA: Insufficient documentation

## 2022-05-21 DIAGNOSIS — Z794 Long term (current) use of insulin: Secondary | ICD-10-CM | POA: Diagnosis not present

## 2022-05-21 DIAGNOSIS — K640 First degree hemorrhoids: Secondary | ICD-10-CM | POA: Insufficient documentation

## 2022-05-21 DIAGNOSIS — G4730 Sleep apnea, unspecified: Secondary | ICD-10-CM | POA: Insufficient documentation

## 2022-05-21 DIAGNOSIS — F32A Depression, unspecified: Secondary | ICD-10-CM | POA: Diagnosis not present

## 2022-05-21 DIAGNOSIS — I10 Essential (primary) hypertension: Secondary | ICD-10-CM | POA: Insufficient documentation

## 2022-05-21 DIAGNOSIS — F419 Anxiety disorder, unspecified: Secondary | ICD-10-CM | POA: Insufficient documentation

## 2022-05-21 DIAGNOSIS — Z9049 Acquired absence of other specified parts of digestive tract: Secondary | ICD-10-CM | POA: Diagnosis not present

## 2022-05-21 DIAGNOSIS — K219 Gastro-esophageal reflux disease without esophagitis: Secondary | ICD-10-CM | POA: Diagnosis not present

## 2022-05-21 DIAGNOSIS — Z7984 Long term (current) use of oral hypoglycemic drugs: Secondary | ICD-10-CM | POA: Diagnosis not present

## 2022-05-21 DIAGNOSIS — Z87891 Personal history of nicotine dependence: Secondary | ICD-10-CM | POA: Diagnosis not present

## 2022-05-21 HISTORY — PX: COLONOSCOPY WITH PROPOFOL: SHX5780

## 2022-05-21 LAB — GLUCOSE, CAPILLARY: Glucose-Capillary: 97 mg/dL (ref 70–99)

## 2022-05-21 SURGERY — COLONOSCOPY WITH PROPOFOL
Anesthesia: General

## 2022-05-21 MED ORDER — PROPOFOL 10 MG/ML IV BOLUS
INTRAVENOUS | Status: AC
  Filled 2022-05-21: qty 20

## 2022-05-21 MED ORDER — SODIUM CHLORIDE 0.9 % IV SOLN: INTRAVENOUS | Status: DC

## 2022-05-21 MED ORDER — PROPOFOL 10 MG/ML IV BOLUS
INTRAVENOUS | Status: DC | PRN
  Administered 2022-05-21: 20 mg via INTRAVENOUS
  Administered 2022-05-21: 30 mg via INTRAVENOUS
  Administered 2022-05-21 (×8): 20 mg via INTRAVENOUS
  Administered 2022-05-21: 50 mg via INTRAVENOUS

## 2022-05-21 MED ORDER — LIDOCAINE HCL (PF) 2 % IJ SOLN
INTRAMUSCULAR | Status: DC | PRN
  Administered 2022-05-21: 30 mg via INTRADERMAL

## 2022-05-21 NOTE — Anesthesia Postprocedure Evaluation (Signed)
Anesthesia Post Note  Patient: Dana Willis  Procedure(s) Performed: COLONOSCOPY WITH PROPOFOL  Patient location during evaluation: PACU Anesthesia Type: General Level of consciousness: awake and alert, oriented and patient cooperative Pain management: pain level controlled Vital Signs Assessment: post-procedure vital signs reviewed and stable Respiratory status: spontaneous breathing, nonlabored ventilation and respiratory function stable Cardiovascular status: blood pressure returned to baseline and stable Postop Assessment: adequate PO intake Anesthetic complications: no   No notable events documented.   Last Vitals:  Vitals:   05/21/22 1011 05/21/22 1021  BP: (!) 126/57 (!) 134/59  Pulse: 67 60  Resp: 18 20  Temp: (!) 35.8 C   SpO2: 98% 96%    Last Pain:  Vitals:   05/21/22 1021  TempSrc:   PainSc: 0-No pain                 Reed Breech

## 2022-05-21 NOTE — Anesthesia Preprocedure Evaluation (Addendum)
Anesthesia Evaluation  Patient identified by MRN, date of birth, ID band Patient awake    Reviewed: Allergy & Precautions, NPO status , Patient's Chart, lab work & pertinent test results  History of Anesthesia Complications (+) PONV and history of anesthetic complications  Airway Mallampati: III   Neck ROM: Full    Dental no notable dental hx.    Pulmonary sleep apnea and Continuous Positive Airway Pressure Ventilation , former smoker (quit 1985)   Pulmonary exam normal breath sounds clear to auscultation       Cardiovascular hypertension, Normal cardiovascular exam Rhythm:Regular Rate:Normal     Neuro/Psych  PSYCHIATRIC DISORDERS Anxiety Depression    Vertigo; normal pressure hydrocephalus s/p VP shunt  Neuromuscular disease (neuropathy)    GI/Hepatic ,GERD  ,,  Endo/Other  diabetes, Type 2, Insulin Dependent  Obesity   Renal/GU negative Renal ROS     Musculoskeletal  (+) Arthritis ,    Abdominal   Peds  Hematology negative hematology ROS (+)   Anesthesia Other Findings Last dose of Ozempic 05/08/22.  Reproductive/Obstetrics                             Anesthesia Physical Anesthesia Plan  ASA: 3  Anesthesia Plan: General   Post-op Pain Management:    Induction: Intravenous  PONV Risk Score and Plan: 4 or greater and Propofol infusion, TIVA and Treatment may vary due to age or medical condition  Airway Management Planned: Natural Airway  Additional Equipment:   Intra-op Plan:   Post-operative Plan:   Informed Consent: I have reviewed the patients History and Physical, chart, labs and discussed the procedure including the risks, benefits and alternatives for the proposed anesthesia with the patient or authorized representative who has indicated his/her understanding and acceptance.       Plan Discussed with: CRNA  Anesthesia Plan Comments: (LMA/GETA backup discussed.   Patient consented for risks of anesthesia including but not limited to:  - adverse reactions to medications - damage to eyes, teeth, lips or other oral mucosa - nerve damage due to positioning  - sore throat or hoarseness - damage to heart, brain, nerves, lungs, other parts of body or loss of life  Informed patient about role of CRNA in peri- and intra-operative care.  Patient voiced understanding.)        Anesthesia Quick Evaluation

## 2022-05-21 NOTE — H&P (Signed)
Outpatient short stay form Pre-procedure 05/21/2022  Regis Bill, MD  Primary Physician: Marina Goodell, MD  Reason for visit:  Screening  History of present illness:    67 y/o lady with history of obesity, DM II, and hypertension here for screening colonoscopy. Last colonoscopy 10 years ago was normal. No blood thinners. No family history of GI malignancies. History of cholecystectomy and possible umbilical hernia repair.    Current Facility-Administered Medications:    0.9 %  sodium chloride infusion, , Intravenous, Continuous, Nahomy Limburg, Rossie Muskrat, MD, Last Rate: 20 mL/hr at 05/21/22 0935, New Bag at 05/21/22 0935  Medications Prior to Admission  Medication Sig Dispense Refill Last Dose   acyclovir (ZOVIRAX) 400 MG tablet Take 400 mg by mouth 2 (two) times daily.   05/19/2022   aspirin 81 MG EC tablet Take by mouth.   05/19/2022   Semaglutide, 1 MG/DOSE, (OZEMPIC, 1 MG/DOSE,) 4 MG/3ML SOPN Inject 0.75 mLs into the skin once a week.   05/08/2022   acyclovir (ZOVIRAX) 400 MG tablet Take 1 tablet by mouth 2 (two) times daily.      ALPRAZolam (XANAX) 0.25 MG tablet Take 0.25 mg by mouth as needed for anxiety.      ALPRAZolam (XANAX) 0.25 MG tablet Take by mouth.      Ascorbic Acid (VITAMIN C) 1000 MG tablet Take 1,000 mg by mouth daily.      aspirin 81 MG tablet Take 81 mg by mouth daily.      Calcium Carbonate (CALCIUM 600 PO) Take by mouth 2 (two) times daily.      Calcium Carbonate-Vitamin D 600-200 MG-UNIT TABS Take by mouth.      Cholecalciferol (VITAMIN D-3) 1000 units CAPS Take by mouth daily.      Cholecalciferol 25 MCG (1000 UT) tablet Take by mouth.      dapagliflozin propanediol (FARXIGA) 10 MG TABS tablet Take 1 tablet by mouth every morning.   05/19/2022   dicyclomine (BENTYL) 10 MG capsule Take by mouth.      doxazosin (CARDURA) 4 MG tablet Take 4 mg by mouth daily.   05/19/2022   doxazosin (CARDURA) 4 MG tablet Take 1 tablet by mouth at bedtime.      ELDERBERRY PO  Take 550 mg by mouth 2 (two) times daily.      FARXIGA 10 MG TABS tablet Take 10 mg by mouth daily.      hydrochlorothiazide (HYDRODIURIL) 25 MG tablet Take 25 mg by mouth daily.   05/19/2022   hydrochlorothiazide (HYDRODIURIL) 25 MG tablet Take 1 tablet by mouth daily.      icosapent Ethyl (VASCEPA) 1 g capsule Take by mouth. (Patient not taking: Reported on 05/21/2022)   Not Taking   lisinopril (ZESTRIL) 20 MG tablet Take 20 mg by mouth daily.      metFORMIN (GLUCOPHAGE) 1000 MG tablet Take 1 tablet by mouth daily.   05/19/2022   metoprolol (LOPRESSOR) 50 MG tablet Take 50 mg by mouth daily.   05/19/2022   metoprolol tartrate (LOPRESSOR) 50 MG tablet Take 1 tablet by mouth daily.      Multiple Vitamin (MULTI-VITAMIN) tablet Take 1 tablet by mouth daily.      Multiple Vitamins-Minerals (MULTIVITAMIN ADULT PO) Take by mouth daily.      omega-3 acid ethyl esters (LOVAZA) 1 g capsule Take by mouth 2 (two) times daily.      omega-3 acid ethyl esters (LOVAZA) 1 g capsule Take 2 capsules by mouth 2 (two) times daily.  pantoprazole (PROTONIX) 40 MG tablet Take 40 mg by mouth daily.   05/19/2022   pantoprazole (PROTONIX) 40 MG tablet Take 1 tablet by mouth daily.      PARoxetine (PAXIL) 10 MG tablet Take 10 mg by mouth at bedtime.   05/19/2022   PARoxetine (PAXIL) 10 MG tablet Take 1 tablet by mouth daily.      pregabalin (LYRICA) 200 MG capsule Take 200 mg by mouth 2 (two) times daily.   05/19/2022   pregabalin (LYRICA) 200 MG capsule Take 1 capsule by mouth 2 (two) times daily.      Probiotic, Lactobacillus, CAPS Take by mouth.      quinapril (ACCUPRIL) 20 MG tablet Take 20 mg by mouth daily.      quinapril (ACCUPRIL) 20 MG tablet Take 1 tablet by mouth daily. (Patient not taking: Reported on 05/21/2022)   Not Taking   rosuvastatin (CRESTOR) 20 MG tablet Take 20 mg by mouth daily.   05/19/2022   rosuvastatin (CRESTOR) 20 MG tablet Take 1 tablet by mouth daily.      sitaGLIPtin-metformin (JANUMET)  50-500 MG tablet Take 1 tablet by mouth 2 (two) times daily with a meal. (Patient not taking: Reported on 05/21/2022)   Not Taking   TRESIBA FLEXTOUCH 200 UNIT/ML FlexTouch Pen Inject into the skin.   05/19/2022     No Known Allergies   Past Medical History:  Diagnosis Date   Anxiety    panic attacks   Depression    Diabetes mellitus without complication    GERD (gastroesophageal reflux disease)    Hydrocephalus    normal pressure   Hypertension    Lumbar disc disorder    compression   Motion sickness    any moving vehicle   Neuropathy of both feet    Pt attributes to DM   PONV (postoperative nausea and vomiting)    Right rotator cuff tear    Sleep apnea    CPAP   Vertigo    daily    Review of systems:  Otherwise negative.    Physical Exam  Gen: Alert, oriented. Appears stated age.  HEENT: PERRLA. Lungs: No respiratory distress CV: RRR Abd: soft, benign, no masses Ext: No edema    Planned procedures: Proceed with colonoscopy. The patient understands the nature of the planned procedure, indications, risks, alternatives and potential complications including but not limited to bleeding, infection, perforation, damage to internal organs and possible oversedation/side effects from anesthesia. The patient agrees and gives consent to proceed.  Please refer to procedure notes for findings, recommendations and patient disposition/instructions.     Regis Bill, MD [facility]

## 2022-05-21 NOTE — Transfer of Care (Signed)
Immediate Anesthesia Transfer of Care Note  Patient: Elmira Olkowski  Procedure(s) Performed: COLONOSCOPY WITH PROPOFOL  Patient Location: PACU and Endoscopy Unit  Anesthesia Type:MAC  Level of Consciousness: drowsy  Airway & Oxygen Therapy: Patient Spontanous Breathing and Patient connected to nasal cannula oxygen  Post-op Assessment: Report given to RN and Post -op Vital signs reviewed and stable  Post vital signs: Reviewed and stable  Last Vitals:  Vitals Value Taken Time  BP    Temp    Pulse    Resp    SpO2      Last Pain:  Vitals:   05/21/22 0919  TempSrc: Temporal         Complications: No notable events documented.

## 2022-05-21 NOTE — Op Note (Signed)
[facility] Patient Name: Dana Willis Procedure Date: 05/21/2022 9:28 AM MRN: [id number] Account #: [id number] Date of Birth: February 25, 1955 Admit Type: Outpatient Age: 67 Room: [facility] ENDO ROOM 3 Gender: Female Note Status: Finalized Instrument Name: Prentice Docker [id number] Procedure:             Colonoscopy Indications:           Screening for colorectal malignant neoplasm Providers:             Eather Colas MD, MD Medicines:             Monitored Anesthesia Care Complications:         No immediate complications. Procedure:             Pre-Anesthesia Assessment:                        - Prior to the procedure, a History and Physical was                         performed, and patient medications and allergies were                         reviewed. The patient is competent. The risks and                         benefits of the procedure and the sedation options and                         risks were discussed with the patient. All questions                         were answered and informed consent was obtained.                         Patient identification and proposed procedure were                         verified by the physician, the nurse, the                         anesthesiologist, the anesthetist and the technician                         in the endoscopy suite. Mental Status Examination:                         alert and oriented. Airway Examination: normal                         oropharyngeal airway and neck mobility. Respiratory                         Examination: clear to auscultation. CV Examination:                         normal. Prophylactic Antibiotics: The patient does not                         require prophylactic antibiotics. Prior  Anticoagulants: The patient has taken no anticoagulant                         or antiplatelet agents. ASA Grade Assessment: III - A                         patient with  severe systemic disease. After reviewing                         the risks and benefits, the patient was deemed in                         satisfactory condition to undergo the procedure. The                         anesthesia plan was to use monitored anesthesia care                         (MAC). Immediately prior to administration of                         medications, the patient was re-assessed for adequacy                         to receive sedatives. The heart rate, respiratory                         rate, oxygen saturations, blood pressure, adequacy of                         pulmonary ventilation, and response to care were                         monitored throughout the procedure. The physical                         status of the patient was re-assessed after the                         procedure.                        After obtaining informed consent, the colonoscope was                         passed under direct vision. Throughout the procedure,                         the patient's blood pressure, pulse, and oxygen                         saturations were monitored continuously. The                         Colonoscope was introduced through the anus and                         advanced to the the cecum, identified by appendiceal  orifice and ileocecal valve. The colonoscopy was                         performed without difficulty. The patient tolerated                         the procedure well. The quality of the bowel                         preparation was adequate to identify polyps. The                         ileocecal valve, appendiceal orifice, and rectum were                         photographed. Findings:      The perianal and digital rectal examinations were normal.      Internal hemorrhoids were found during retroflexion. The hemorrhoids       were Grade I (internal hemorrhoids that do not prolapse).      The exam was otherwise without  abnormality on direct and retroflexion       views. Impression:            - Internal hemorrhoids.                        - The examination was otherwise normal on direct and                         retroflexion views.                        - No specimens collected. Recommendation:        - Discharge patient to home.                        - Resume previous diet.                        - Continue present medications.                        - Repeat colonoscopy in 10 years for screening                         purposes.                        - Return to referring physician as previously                         scheduled. Procedure Code(s):     --- Professional ---                        Z6109, Colorectal cancer screening; colonoscopy on                         individual not meeting criteria for high risk Diagnosis Code(s):     --- Professional ---  Z12.11, Encounter for screening for malignant neoplasm                         of colon                        K64.0, First degree hemorrhoids CPT copyright 2022 American Medical Association. All rights reserved. The codes documented in this report are preliminary and upon coder review may  be revised to meet current compliance requirements. Eather Colas MD, MD 05/21/2022 10:13:16 AM Number of Addenda: 0 Note Initiated On: 05/21/2022 9:28 AM Scope Withdrawal Time: 0 hours 10 minutes 42 seconds  Total Procedure Duration: 0 hours 20 minutes 13 seconds  Estimated Blood Loss:  Estimated blood loss: none.      [facility]

## 2022-05-21 NOTE — Interval H&P Note (Signed)
History and Physical Interval Note:  05/21/2022 9:38 AM  Dana Willis  has presented today for surgery, with the diagnosis of CCA  SCREEN.  The various methods of treatment have been discussed with the patient and family. After consideration of risks, benefits and other options for treatment, the patient has consented to  Procedure(s): COLONOSCOPY WITH PROPOFOL (N/A) as a surgical intervention.  The patient's history has been reviewed, patient examined, no change in status, stable for surgery.  I have reviewed the patient's chart and labs.  Questions were answered to the patient's satisfaction.     Regis Bill  Ok to proceed with colonoscopy

## 2022-05-24 ENCOUNTER — Encounter: Payer: Self-pay | Admitting: Gastroenterology

## 2022-08-16 NOTE — H&P (Signed)
Preoperative History and Physical  Chief Complaint: Dana Willis is a 67 y.o. G1P1001 here for surgical management of postmenopausal bleeding, likely endometrial polyp, thickened endometrium.   No significant preoperative concerns.  History of Present Illness: 67 y.o. G31P1001 female who presents with postmenopausal bleeding. This started about 1 month ago. She describes the bleeding as "spots" almost every day.  She does have to wear a pad.  She has had some cramping (like when she used to have her period).  Occasionally, she has a stabbing pain in her lower abdomen.  She denies unintended weight loss. She notes occasional issue with constipation. She denies bloating and early satiety.  She is taking Ozempic.  So, she's not a hungry as she used to be.     She had a pelvic u/s on 7/2 that showed an endometrial polyp that measured 12 x 8 x 9 mm.   Her endometrial stripe was 9.6 mm.   Proposed surgery:  Hysteroscopy, dilation and curettage, endometrial polypectomy  Past Medical History:  Diagnosis Date   Anxiety    B12 deficiency anemia    GERD (gastroesophageal reflux disease)    HSV-2 infection    Hyperlipidemia    Hypertension    Infertility management    NPH (normal pressure hydrocephalus) (CMS/HHS-HCC)    Sleep apnea    Type 2 diabetes mellitus (CMS/HHS-HCC)    Insulin started 01/2016   Vitamin D deficiency disease    Past Surgical History:  Procedure Laterality Date   vp shunt  2008   hydrocephalus - in [place]   Colon @ [facility]  05/21/2022   Colonoscopy unremarkable. Repeat in 10 years/CTL   CESAREAN SECTION     CHOLECYSTECTOMY     HERNIA REPAIR     OTHER SURGERY     CRYOSURGERY   TUBAL LIGATION     OB History  Gravida Para Term Preterm AB Living  1 1 1     1   SAB IAB Ectopic Molar Multiple Live Births            1    # Outcome Date GA Lbr Len/2nd Weight Sex Type Anes PTL Lv  1 Term     M CS-LTranv   LIV     Complications: Breech presentation (HHS-HCC), Prolapsed  cord (HHS-HCC)  Patient denies any other pertinent gynecologic issues.   Current Outpatient Medications on File Prior to Visit  Medication Sig Dispense Refill   ACCU-CHEK GUIDE GLUCOSE METER Misc Use daily to check blood sugars 1 each 0   acyclovir (ZOVIRAX) 400 MG tablet TAKE 1 TABLET TWICE A DAY 180 tablet 3   ALPRAZolam (XANAX) 0.25 MG tablet Take 1 tablet (0.25 mg total) by mouth once daily as needed for Sleep 30 tablet 0   ascorbic acid, vitamin C, (VITAMIN C) 1000 MG tablet Take 1,000 mg by mouth once daily.     aspirin 81 MG EC tablet Take 81 mg by mouth once daily.     BD NANO 2ND GEN PEN NEEDLE 32 gauge x 5/32" Ndle USE AND DISCARD 1 PEN NEEDLE DAILY. 90 each 3   blood glucose diagnostic (ACCU-CHEK GUIDE TEST STRIPS) test strip Use 3 times daily 300 strip 3   calcium carbonate-vitamin D3 (CALTRATE 600+D) 600 mg(1,500mg ) -200 unit tablet Take 1 tablet by mouth 2 (two) times daily with meals.     cholecalciferol (VITAMIN D3) 1000 unit tablet Take 1,000 Units by mouth once daily.       cinnamon bark (CINNAMON ORAL) Take  1 tablet by mouth 2 (two) times daily     dapagliflozin propanediol (FARXIGA) 10 mg tablet Take 1 tablet (10 mg total) by mouth every morning 90 tablet 3   DEXCOM G7 SENSOR Devi Use 1 each every 10 (ten) days 3 each 12   dicyclomine (BENTYL) 10 mg capsule Take 10 mg by mouth 4 (four) times daily as needed     doxazosin (CARDURA) 4 MG tablet TAKE 1 TABLET NIGHTLY 90 tablet 3   ELDERBERRY FRUIT ORAL Take by mouth 2 (two) times daily.       hydroCHLOROthiazide (HYDRODIURIL) 25 MG tablet TAKE 1 TABLET DAILY 90 tablet 3   insulin DEGLUDEC (TRESIBA FLEXTOUCH U-200) pen injector (concentration 200 units/mL) INJECT 90 UNITS            SUBCUTANEOUSLY DAILY 45 mL 3   Lactobacillus acidophilus 10 billion cell Cap Take by mouth once daily     lisinopriL (ZESTRIL) 20 MG tablet Take 1 tablet (20 mg total) by mouth once daily 90 tablet 3   metFORMIN (GLUCOPHAGE) 1000 MG tablet TAKE 1  TABLET DAILY 90 tablet 3   metoprolol tartrate (LOPRESSOR) 50 MG tablet TAKE 1 TABLET DAILY 90 tablet 3   multivitamin tablet Take 1 tablet by mouth once daily     omega-3 acid ethyl esters (LOVAZA) 1 gram capsule Take 2 capsules (2 g total) by mouth 2 (two) times daily 360 capsule 3   pantoprazole (PROTONIX) 40 MG DR tablet TAKE 1 TABLET DAILY 90 tablet 3   PARoxetine (PAXIL) 10 MG tablet TAKE 1 TABLET ONCE DAILY 90 tablet 3   pregabalin (LYRICA) 200 MG capsule Take 1 capsule (200 mg total) by mouth 2 (two) times daily 180 capsule 1   rosuvastatin (CRESTOR) 20 MG tablet TAKE 1 TABLET DAILY 90 tablet 3   semaglutide (OZEMPIC) 1 mg/dose (4 mg/3 mL) pen injector Inject 0.75 mLs (1 mg total) subcutaneously once a week 9 mL 5   triamcinolone 0.5 % cream Apply topically 2 (two) times daily Apply 2 x per day (up to 7-10 days) (Patient taking differently: Apply topically 2 (two) times daily as needed) 30 g 0   TURMERIC ORAL Take 1 capsule by mouth once daily     No current facility-administered medications on file prior to visit.   No Known Allergies  Social History:   reports that she quit smoking about 39 years ago. Her smoking use included cigarettes. She has never used smokeless tobacco. She reports current alcohol use. She reports that she does not use drugs.  Family History  Problem Relation Name Age of Onset   Diabetes type II Mother     High blood pressure (Hypertension) Mother     Diabetes type II Father     High blood pressure (Hypertension) Father     Coronary Artery Disease (Blocked arteries around heart) Father     Stroke Father     High blood pressure (Hypertension) Brother      Review of Systems: Noncontributory  PHYSICAL EXAM: Blood pressure 109/60, pulse 74, height 152.4 cm (5'), weight 84.1 kg (185 lb 6.4 oz). CONSTITUTIONAL: Well-developed, well-nourished female in no acute distress.  HENT:  Normocephalic, atraumatic, External right and left ear normal. Oropharynx is  clear and moist EYES: Conjunctivae and EOM are normal. Pupils are equal, round, and reactive to light. No scleral icterus.  NECK: Normal range of motion, supple, no masses SKIN: Skin is warm and dry. No rash noted. Not diaphoretic. No erythema. No pallor.  NEUROLGIC: Alert and oriented to person, place, and time. Normal reflexes, muscle tone coordination. No cranial nerve deficit noted. PSYCHIATRIC: Normal mood and affect. Normal behavior. Normal judgment and thought content. CARDIOVASCULAR: Normal heart rate noted, regular rhythm RESPIRATORY: Effort and breath sounds normal, no problems with respiration noted ABDOMEN: Soft, nontender, nondistended. PELVIC: Deferred MUSCULOSKELETAL: Normal range of motion. No edema and no tenderness. 2+ distal pulses.  Labs: No results found for this or any previous visit (from the past 336 hour(s)).  Imaging Studies: [place] [facility] pelvic transvaginal  Result Date: 08/03/2022 Indication ======== PMB Results ====== TO VIEW THE FORMAL REPORT IN [facility], OPEN THE PDF (click on Imaging result with paper clip icon, then click link under "Scans on Order"). The plain text version does not include embedded images or graphs. The image link is for reference only. ViewPoint is the official diagnostic image storage site. Endovaginal Imaging ================ Indication: Endovaginal imaging was necessary to evaluate the uterus and ovaries. Probe: [id number]. Uterus ====== Visualized. Size 67 mm x 45 mm x 37 mm Normal Position: anteflexed Myometrium: Heterogeneous Endometrium: Focal Thickening. Endometrial thickness, total 9.6 mm Cervix details: nabothian cysts visualized Fibroid(s) Possible necrotic. Size 23.00 mm x 17.00 mm x 18 mm. Mean 19.3 mm. Vol 3.7 cm. FIGO - 6 (subserosal but < 50% intramural). Left        lateral posterior wall Polyp(s)   Size 12 mm x 8 mm x 9 mm. Mean 9.7 mm. Smooth Right Ovary ========= Visualized, slightly prominent. Outline: Normal. Morphology: Appropriate.  Size 25 mm x 24 mm x 19 mm Follicles identified Cyst(s)    Size 22.0 mm x 20.0 mm x 17 mm. Mean 19.7 mm. Vol 3.917 cm. Complex; septation=0.11cm Left Ovary ======== Visualized, Normal. Outline: Normal. Morphology: Appropriate. Size 24 mm x 10 mm x 13 mm No cysts identified Follicles identified Cul de Sac ========= Normal. Anechoic. Free fluid visualized: mild Impression ========= Endovaginal ultrasound performed today due to the indications outlined above. The sonogram reveals an anteflexed heterogeneous uterus with possible necrotic fibroid. The endometrium is focally thickened at 9.6 mm with possible polyp as described above. Right ovary contains a 22 mm follicle as described above. Small amt of free fluid seen posterior cul-de-sac   Assessment: 1. PMB (postmenopausal bleeding)   2. Endometrial polyp   3. Thickened endometrium      Plan: Patient will undergo surgical management with the above-noted surgery.   The risks of surgery were discussed in detail with the patient including but not limited to: bleeding which may require transfusion or reoperation; infection which may require antibiotics; injury to surrounding organs which may involve bowel, bladder, ureters ; need for additional procedures including laparoscopy or laparotomy; thromboembolic phenomenon, surgical site problems and other postoperative/anesthesia complications. Likelihood of success in alleviating the patient's condition was discussed. Routine postoperative instructions will be reviewed with the patient and her family in detail after surgery.  The patient concurred with the proposed plan, giving informed written consent for the surgery.   Preoperative prophylactic antibiotics, as indicated, and SCDs ordered on call to the OR.     Attestation Statement:   I personally performed the service. (TP)  Vinny Taranto Teola Bradley, MD  [facility] 08/16/2022 3:30 PM

## 2022-08-18 ENCOUNTER — Encounter
Admission: RE | Admit: 2022-08-18 | Discharge: 2022-08-18 | Disposition: A | Discharge status: Home / Self Care | Payer: Medicare Other | Source: Ambulatory Visit | Attending: Obstetrics and Gynecology | Admitting: Obstetrics and Gynecology

## 2022-08-18 ENCOUNTER — Other Ambulatory Visit: Payer: Self-pay

## 2022-08-18 VITALS — Ht 61.0 in | Wt 182.0 lb

## 2022-08-18 DIAGNOSIS — Z794 Long term (current) use of insulin: Secondary | ICD-10-CM

## 2022-08-18 DIAGNOSIS — E1142 Type 2 diabetes mellitus with diabetic polyneuropathy: Secondary | ICD-10-CM

## 2022-08-18 DIAGNOSIS — Z01818 Encounter for other preprocedural examination: Secondary | ICD-10-CM

## 2022-08-18 DIAGNOSIS — I10 Essential (primary) hypertension: Secondary | ICD-10-CM

## 2022-08-18 HISTORY — DX: Type 2 diabetes mellitus without complications: Z79.4

## 2022-08-18 HISTORY — DX: Encounter for procreative management, unspecified: Z31.9

## 2022-08-18 HISTORY — DX: Vitamin D deficiency, unspecified: E55.9

## 2022-08-18 HISTORY — DX: (Idiopathic) normal pressure hydrocephalus: G91.2

## 2022-08-18 HISTORY — DX: Hyperlipidemia, unspecified: E78.5

## 2022-08-18 HISTORY — DX: Type 2 diabetes mellitus without complications: E11.9

## 2022-08-18 HISTORY — DX: Vitamin B12 deficiency anemia, unspecified: D51.9

## 2022-08-18 HISTORY — DX: Herpesviral infection, unspecified: B00.9

## 2022-08-18 NOTE — Patient Instructions (Addendum)
Your procedure is scheduled on: Thursday, July 25 Report to the Registration Desk on the 1st floor of the [facility] To find out your arrival time, please call [phone] between 1PM - 3PM on: Wednesday , July 24 If your arrival time is 6:00 am, do not arrive before that time as the [facility] entrance doors do not open until 6:00 am.  REMEMBER: Instructions that are not followed completely may result in serious medical risk, up to and including death; or upon the discretion of your surgeon and anesthesiologist your surgery may need to be rescheduled.  Do not eat food after midnight the night before surgery.  No gum chewing or hard candies.  You may however, you may have WATER  up to 2 hours before you are scheduled to arrive for your surgery. Do not drink anything within 2 hours of your scheduled arrival time.  In addition, your doctor has ordered for you to drink the provided:  Gatorade G2 Drinking this carbohydrate drink up to two hours before surgery helps to reduce insulin resistance and improve patient outcomes. Please complete drinking 2 hours before scheduled arrival time.  One week prior to surgery: Stop Anti-inflammatories (NSAIDS) such as Advil, Aleve, Ibuprofen, Motrin, Naproxen, Naprosyn and Aspirin based products such as Excedrin, Goody's Powder, BC Powder.  Stop ANY OVER THE COUNTER supplements until after surgery. Ascorbic Acid (VITAMIN C)  Calcium Carbonate (CALCIUM ) Cholecalciferol  ELDERBERRY  Multiple Vitamins-Minerals (MULTIVITAMIN ADULT ) Probiotic, Lactobacillus    You may however, continue to take Tylenol if needed for pain up until the day of surgery.  Continue taking all prescribed medications with the exception of the following: Stop taking dapagliflozin propanediol (FARXIGA) 3 days prior to surgery. Last dose  Sunday July 21 Stop taking metFORMIN (GLUCOPHAGE ) 2 days prior to surgery. Last dose Monday July 22. DO NOT TAKE Semaglutide, 1 MG/DOSE,  (OZEMPIC) DOSE SATURDAY ( July 20) Stop taking aspirin 7 days prior to surgery  TAKE ONLY THESE MEDICATIONS THE MORNING OF SURGERY WITH A SIP OF WATER:  pantoprazole (PROTONIX) 40 (take one the night before and one on the morning of surgery - helps to prevent nausea after surgery.)  pregabalin (LYRICA)  metoprolol (LOPRESSOR)     No Alcohol for 24 hours before or after surgery.  No Smoking including e-cigarettes for 24 hours before surgery.  No chewable tobacco products for at least 6 hours before surgery.  No nicotine patches on the day of surgery.  Do not use any "recreational" drugs for at least a week (preferably 2 weeks) before your surgery.  Please be advised that the combination of cocaine and anesthesia may have negative outcomes, up to and including death. If you test positive for cocaine, your surgery will be cancelled.  On the morning of surgery brush your teeth with toothpaste and water, you may rinse your mouth with mouthwash if you wish. Do not swallow any toothpaste or mouthwash.  Do not wear jewelry, make-up, hairpins, clips or nail polish.  Do not wear lotions, powders, or perfumes.   Do not shave body hair from the neck down 48 hours before surgery.  Contact lenses, hearing aids and dentures may not be worn into surgery.  Do not bring valuables to the hospital. [facility] is not responsible for any missing/lost belongings or valuables.   Bring your C-PAP to the hospital in case you may have to spend the night.   Notify your doctor if there is any change in your medical  condition (cold, fever, infection).  Wear comfortable clothing (specific to your surgery type) to the hospital.  After surgery, you can help prevent lung complications by doing breathing exercises.  Take deep breaths and cough every 1-2 hours.  If you are being discharged the day of surgery, you will not be allowed to drive home. You will need a responsible individual to drive you home and  stay with you for 24 hours after surgery.   If you are taking public transportation, you will need to have a responsible individual with you.  Please call the Pre-admissions Testing Dept. at [phone] if you have any questions about these instructions.  Surgery Visitation Policy:  Patients having surgery or a procedure may have two visitors.  Children under the age of 63 must have an adult with them who is not the patient.

## 2022-08-23 ENCOUNTER — Ambulatory Visit: Payer: Medicare Other | Admitting: Internal Medicine

## 2022-08-23 ENCOUNTER — Encounter
Admission: RE | Admit: 2022-08-23 | Discharge: 2022-08-23 | Disposition: A | Discharge status: Home / Self Care | Payer: Medicare Other | Source: Ambulatory Visit | Attending: Obstetrics and Gynecology | Admitting: Obstetrics and Gynecology

## 2022-08-23 DIAGNOSIS — Z794 Long term (current) use of insulin: Secondary | ICD-10-CM | POA: Diagnosis not present

## 2022-08-23 DIAGNOSIS — E119 Type 2 diabetes mellitus without complications: Secondary | ICD-10-CM | POA: Diagnosis not present

## 2022-08-23 DIAGNOSIS — Z01818 Encounter for other preprocedural examination: Secondary | ICD-10-CM | POA: Insufficient documentation

## 2022-08-23 DIAGNOSIS — Z01810 Encounter for preprocedural cardiovascular examination: Secondary | ICD-10-CM | POA: Diagnosis not present

## 2022-08-23 DIAGNOSIS — E1142 Type 2 diabetes mellitus with diabetic polyneuropathy: Secondary | ICD-10-CM | POA: Insufficient documentation

## 2022-08-23 DIAGNOSIS — I10 Essential (primary) hypertension: Secondary | ICD-10-CM | POA: Diagnosis not present

## 2022-08-23 LAB — BASIC METABOLIC PANEL
Anion gap: 7 (ref 5–15)
BUN: 18 mg/dL (ref 8–23)
CO2: 27 mmol/L (ref 22–32)
Calcium: 9.1 mg/dL (ref 8.9–10.3)
Chloride: 107 mmol/L (ref 98–111)
Creatinine, Ser: 0.95 mg/dL (ref 0.44–1.00)
GFR, Estimated: >60 mL/min (ref >60)
Glucose, Bld: 224 mg/dL — ABNORMAL HIGH (ref 70–99)
Potassium: 3.5 mmol/L (ref 3.5–5.1)
Sodium: 141 mmol/L (ref 135–145)

## 2022-08-23 LAB — CBC
HCT: 39 % (ref 36.0–46.0)
Hemoglobin: 12.8 g/dL (ref 12.0–15.0)
MCH: 29 pg (ref 26.0–34.0)
MCHC: 32.8 g/dL (ref 30.0–36.0)
MCV: 88.4 fL (ref 80.0–100.0)
Platelets: 252 10*3/uL (ref 150–400)
RBC: 4.41 MIL/uL (ref 3.87–5.11)
RDW: 15.7 % — ABNORMAL HIGH (ref 11.5–15.5)
WBC: 6.8 10*3/uL (ref 4.0–10.5)
nRBC: 0 % (ref 0.0–0.2)

## 2022-08-23 NOTE — Progress Notes (Signed)
Pt rescheduled her appointment

## 2022-08-26 ENCOUNTER — Encounter: Payer: Self-pay | Admitting: Obstetrics and Gynecology

## 2022-08-26 ENCOUNTER — Ambulatory Visit
Admission: RE | Admit: 2022-08-26 | Discharge: 2022-08-26 | Disposition: A | Discharge status: Home / Self Care | Payer: Medicare Other | Attending: Obstetrics and Gynecology | Admitting: Obstetrics and Gynecology

## 2022-08-26 ENCOUNTER — Ambulatory Visit: Payer: Medicare Other | Admitting: Urgent Care

## 2022-08-26 ENCOUNTER — Ambulatory Visit: Payer: Medicare Other | Admitting: Certified Registered"

## 2022-08-26 ENCOUNTER — Encounter
Admission: RE | Disposition: A | Discharge status: Home / Self Care | Payer: Self-pay | Source: Home / Self Care | Attending: Obstetrics and Gynecology

## 2022-08-26 ENCOUNTER — Other Ambulatory Visit: Payer: Self-pay

## 2022-08-26 DIAGNOSIS — Z7984 Long term (current) use of oral hypoglycemic drugs: Secondary | ICD-10-CM | POA: Insufficient documentation

## 2022-08-26 DIAGNOSIS — K219 Gastro-esophageal reflux disease without esophagitis: Secondary | ICD-10-CM | POA: Insufficient documentation

## 2022-08-26 DIAGNOSIS — E119 Type 2 diabetes mellitus without complications: Secondary | ICD-10-CM | POA: Insufficient documentation

## 2022-08-26 DIAGNOSIS — Z6834 Body mass index (BMI) 34.0-34.9, adult: Secondary | ICD-10-CM | POA: Diagnosis not present

## 2022-08-26 DIAGNOSIS — E1142 Type 2 diabetes mellitus with diabetic polyneuropathy: Secondary | ICD-10-CM

## 2022-08-26 DIAGNOSIS — I10 Essential (primary) hypertension: Secondary | ICD-10-CM | POA: Diagnosis not present

## 2022-08-26 DIAGNOSIS — R9389 Abnormal findings on diagnostic imaging of other specified body structures: Secondary | ICD-10-CM | POA: Diagnosis present

## 2022-08-26 DIAGNOSIS — Z87891 Personal history of nicotine dependence: Secondary | ICD-10-CM | POA: Insufficient documentation

## 2022-08-26 DIAGNOSIS — G4730 Sleep apnea, unspecified: Secondary | ICD-10-CM | POA: Diagnosis not present

## 2022-08-26 DIAGNOSIS — N950 Postmenopausal bleeding: Secondary | ICD-10-CM | POA: Diagnosis present

## 2022-08-26 DIAGNOSIS — Z79899 Other long term (current) drug therapy: Secondary | ICD-10-CM | POA: Insufficient documentation

## 2022-08-26 DIAGNOSIS — Z982 Presence of cerebrospinal fluid drainage device: Secondary | ICD-10-CM | POA: Insufficient documentation

## 2022-08-26 DIAGNOSIS — F32A Depression, unspecified: Secondary | ICD-10-CM | POA: Insufficient documentation

## 2022-08-26 DIAGNOSIS — Z7985 Long-term (current) use of injectable non-insulin antidiabetic drugs: Secondary | ICD-10-CM | POA: Diagnosis not present

## 2022-08-26 DIAGNOSIS — N840 Polyp of corpus uteri: Secondary | ICD-10-CM | POA: Insufficient documentation

## 2022-08-26 DIAGNOSIS — Z794 Long term (current) use of insulin: Secondary | ICD-10-CM | POA: Diagnosis not present

## 2022-08-26 HISTORY — PX: HYSTEROSCOPY WITH D & C: SHX1775

## 2022-08-26 LAB — GLUCOSE, CAPILLARY
Glucose-Capillary: 118 mg/dL — ABNORMAL HIGH (ref 70–99)
Glucose-Capillary: 117 mg/dL — ABNORMAL HIGH (ref 70–99)

## 2022-08-26 SURGERY — DILATATION AND CURETTAGE /HYSTEROSCOPY
Anesthesia: General | Site: Vagina

## 2022-08-26 MED ORDER — FENTANYL CITRATE (PF) 100 MCG/2ML IJ SOLN
25.0000 ug | INTRAMUSCULAR | Status: DC | PRN
  Administered 2022-08-26 (×4): 25 ug via INTRAVENOUS

## 2022-08-26 MED ORDER — CEFAZOLIN SODIUM-DEXTROSE 2-4 GM/100ML-% IV SOLN
2.0000 g | Freq: Once | INTRAVENOUS | Status: AC
  Administered 2022-08-26: 2 g via INTRAVENOUS

## 2022-08-26 MED ORDER — FENTANYL CITRATE (PF) 100 MCG/2ML IJ SOLN
INTRAMUSCULAR | Status: AC
  Filled 2022-08-26: qty 2

## 2022-08-26 MED ORDER — FENTANYL CITRATE (PF) 100 MCG/2ML IJ SOLN
INTRAMUSCULAR | Status: DC | PRN
  Administered 2022-08-26 (×2): 25 ug via INTRAVENOUS
  Administered 2022-08-26: 50 ug via INTRAVENOUS

## 2022-08-26 MED ORDER — HYDROCODONE-ACETAMINOPHEN 5-325 MG PO TABS
1.0000 | ORAL_TABLET | Freq: Three times a day (TID) | ORAL | 0 refills | Status: DC | PRN
Start: 2022-08-26 — End: 2022-08-26

## 2022-08-26 MED ORDER — ONDANSETRON HCL 4 MG/2ML IJ SOLN
INTRAMUSCULAR | Status: DC | PRN
  Administered 2022-08-26: 4 mg via INTRAVENOUS

## 2022-08-26 MED ORDER — PROPOFOL 1000 MG/100ML IV EMUL
INTRAVENOUS | Status: AC
  Filled 2022-08-26: qty 100

## 2022-08-26 MED ORDER — LIDOCAINE HCL (CARDIAC) PF 100 MG/5ML IV SOSY
PREFILLED_SYRINGE | INTRAVENOUS | Status: DC | PRN
  Administered 2022-08-26: 80 mg via INTRAVENOUS

## 2022-08-26 MED ORDER — HYDROCODONE-ACETAMINOPHEN 5-325 MG PO TABS
1.0000 | ORAL_TABLET | Freq: Three times a day (TID) | ORAL | 0 refills | Status: DC | PRN
Start: 2022-08-26 — End: 2022-11-22

## 2022-08-26 MED ORDER — SUCCINYLCHOLINE CHLORIDE 200 MG/10ML IV SOSY
PREFILLED_SYRINGE | INTRAVENOUS | Status: AC
  Filled 2022-08-26: qty 10

## 2022-08-26 MED ORDER — DEXAMETHASONE SODIUM PHOSPHATE 10 MG/ML IJ SOLN
INTRAMUSCULAR | Status: DC | PRN
  Administered 2022-08-26: 5 mg via INTRAVENOUS

## 2022-08-26 MED ORDER — MIDAZOLAM HCL 2 MG/2ML IJ SOLN
INTRAMUSCULAR | Status: AC
  Filled 2022-08-26: qty 2

## 2022-08-26 MED ORDER — SILVER NITRATE-POT NITRATE 75-25 % EX MISC
CUTANEOUS | Status: DC | PRN
  Administered 2022-08-26: 2

## 2022-08-26 MED ORDER — GLYCOPYRROLATE 0.2 MG/ML IJ SOLN
INTRAMUSCULAR | Status: AC
  Filled 2022-08-26: qty 1

## 2022-08-26 MED ORDER — MIDAZOLAM HCL 2 MG/2ML IJ SOLN
INTRAMUSCULAR | Status: DC | PRN
  Administered 2022-08-26: 2 mg via INTRAVENOUS

## 2022-08-26 MED ORDER — PROPOFOL 10 MG/ML IV BOLUS
INTRAVENOUS | Status: DC | PRN
Start: 2022-08-26 — End: 2022-08-26
  Administered 2022-08-26: 140 mg via INTRAVENOUS

## 2022-08-26 MED ORDER — LIDOCAINE HCL (PF) 2 % IJ SOLN
INTRAMUSCULAR | Status: AC
  Filled 2022-08-26: qty 5

## 2022-08-26 MED ORDER — CHLORHEXIDINE GLUCONATE 0.12 % MT SOLN
OROMUCOSAL | Status: AC
  Filled 2022-08-26: qty 15

## 2022-08-26 MED ORDER — SODIUM CHLORIDE 0.9 % IV SOLN: INTRAVENOUS | Status: DC

## 2022-08-26 MED ORDER — KETOROLAC TROMETHAMINE 30 MG/ML IJ SOLN
INTRAMUSCULAR | Status: AC
  Filled 2022-08-26: qty 1

## 2022-08-26 MED ORDER — CEFAZOLIN SODIUM-DEXTROSE 2-4 GM/100ML-% IV SOLN
INTRAVENOUS | Status: AC
  Filled 2022-08-26: qty 100

## 2022-08-26 MED ORDER — GLYCOPYRROLATE 0.2 MG/ML IJ SOLN
INTRAMUSCULAR | Status: DC | PRN
  Administered 2022-08-26: .1 mg via INTRAVENOUS

## 2022-08-26 MED ORDER — DEXAMETHASONE SODIUM PHOSPHATE 10 MG/ML IJ SOLN
INTRAMUSCULAR | Status: AC
  Filled 2022-08-26: qty 1

## 2022-08-26 MED ORDER — ORAL CARE MOUTH RINSE: 15.0000 mL | Freq: Once | OROMUCOSAL | Status: AC

## 2022-08-26 MED ORDER — SODIUM CHLORIDE 0.9 % IR SOLN
Status: DC | PRN
  Administered 2022-08-26: 1200 mL

## 2022-08-26 MED ORDER — ONDANSETRON HCL 4 MG/2ML IJ SOLN: 4.0000 mg | Freq: Once | INTRAMUSCULAR | Status: DC | PRN

## 2022-08-26 MED ORDER — SILVER NITRATE-POT NITRATE 75-25 % EX MISC
CUTANEOUS | Status: AC
  Filled 2022-08-26: qty 10

## 2022-08-26 MED ORDER — IBUPROFEN 600 MG PO TABS
600.0000 mg | ORAL_TABLET | Freq: Four times a day (QID) | ORAL | 0 refills | Status: DC | PRN
Start: 2022-08-26 — End: 2022-11-24

## 2022-08-26 MED ORDER — PROPOFOL 500 MG/50ML IV EMUL
INTRAVENOUS | Status: DC | PRN
  Administered 2022-08-26: 100 ug/kg/min via INTRAVENOUS

## 2022-08-26 MED ORDER — ONDANSETRON HCL 4 MG/2ML IJ SOLN
INTRAMUSCULAR | Status: AC
  Filled 2022-08-26: qty 2

## 2022-08-26 MED ORDER — SUCCINYLCHOLINE CHLORIDE 200 MG/10ML IV SOSY
PREFILLED_SYRINGE | INTRAVENOUS | Status: DC | PRN
  Administered 2022-08-26: 100 mg via INTRAVENOUS

## 2022-08-26 MED ORDER — CHLORHEXIDINE GLUCONATE 0.12 % MT SOLN
15.0000 mL | Freq: Once | OROMUCOSAL | Status: AC
  Administered 2022-08-26: 15 mL via OROMUCOSAL

## 2022-08-26 SURGICAL SUPPLY — 14 items
ELECT REM PT RETURN 9FT ADLT (ELECTROSURGICAL) ×1
ELECTRODE REM PT RTRN 9FT ADLT (ELECTROSURGICAL) ×1 IMPLANT
GLOVE BIO SURGEON STRL SZ7 (GLOVE) ×1 IMPLANT
GLOVE BIOGEL PI IND STRL 7.5 (GLOVE) ×1 IMPLANT
GOWN STRL REUS W/ TWL LRG LVL3 (GOWN DISPOSABLE) ×2 IMPLANT
GOWN STRL REUS W/TWL LRG LVL3 (GOWN DISPOSABLE) ×3
IV NS IRRIG 3000ML ARTHROMATIC (IV SOLUTION) ×1 IMPLANT
KIT PROCEDURE FLUENT (KITS) ×1 IMPLANT
KIT TURNOVER CYSTO (KITS) ×1 IMPLANT
NS IRRIG 500ML POUR BTL (IV SOLUTION) IMPLANT
PACK DNC HYST (MISCELLANEOUS) ×1 IMPLANT
PAD PREP OB/GYN DISP 24X41 (PERSONAL CARE ITEMS) ×1 IMPLANT
SCRUB CHG 4% DYNA-HEX 4OZ (MISCELLANEOUS) ×1 IMPLANT
SEAL ROD LENS SCOPE MYOSURE (ABLATOR) ×1 IMPLANT

## 2022-08-26 NOTE — Op Note (Signed)
  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   Procedure Summary:  After informed consent was obtained, the patient was taken to the operating room where anesthesia was obtained without difficulty. The patient was positioned in the dorsal lithotomy position in Golf stirrups.  The patient's bladder was catheterized with an in and out foley catheter.  The patient was examined under anesthesia, with the above noted findings.  The bi-valved speculum was placed inside the patient's vagina, and the the anterior lip of the cervix was grasped with the tenaculum.  Then the cervix was progressively dilated to a 7 mm Hegar dilator. Notably, the external cervical os was able to be cannulated, but I was unable to insert the dilator far enough to feel confident that I was entering the uterine cavity. So, the external cervical os was dilated to a 7 mm Hegar dilator. The hysteroscope was introduced. As suspected the hysteroscope would only pass to the level of the internal cervical os.  This appeared to be heading in a more anterior direction. The hysteroscope was removed and the smallest dilator was used to gently try to find the tract. There did feel like there was passage of the  dilator through the internal os. So, dilation was continued up until about a 5 mm Hegar dilator.  The internal cervical os appeared to be more dilated. So, the scope was removed and the dilation continued to the 7 mm Hegar. The hysteroscope was then passed through the os and visible were the epiploica of the bowel and the fluid deficit rapidly increased. An attempt was made to retreat the scope to see where another path might be, but another path was not appreciated. The hysteroscope was removed and she was observed for bleeding or some change in her vital signs.  She had no significant bleeding noted, nor was any significant bleeding noted on hysteroscopy.  The procedure was terminated at this point due to the uterine perforation and no obvious was to safely salvage the case.     The single-tooth tenaculum was removed and with silver nitrate, hemostasis was obtained.  She was observed for about 10 minutes with no bleeding noted coming from the cervical os, even with massage of the uterus. After verifying that no instruments were left behind in her vagina, the speculum was removed.   The patient tolerated the procedure well.  Sponge, lap, needle, and instrument counts were correct x 2.  VTE prophylaxis: SCDs. Antibiotic prophylaxis: none indicated. She was awakened in the operating room and was taken to the PACU in stable condition.   Thomasene Mohair, MD 08/26/2022 11:28 AM

## 2022-08-26 NOTE — OR Nursing (Signed)
In and out urinary catheterization performed by surgeon at case start.

## 2022-08-26 NOTE — Anesthesia Postprocedure Evaluation (Signed)
Anesthesia Post Note  Patient: Dana Willis  Procedure(s) Performed: ATTEMPTED HYSTEROSCOPY, DILATATION AND CURETTAGE (Vagina )  Patient location during evaluation: PACU Anesthesia Type: General Level of consciousness: awake Pain management: satisfactory to patient Vital Signs Assessment: post-procedure vital signs reviewed and stable Respiratory status: spontaneous breathing Cardiovascular status: stable Anesthetic complications: no   No notable events documented.   Last Vitals:  Vitals:   08/26/22 1139 08/26/22 1140  BP: (!) 151/60 (!) 151/60  Pulse: (!) 56 (!) 53  Resp: 14 11  Temp:    SpO2: 100% 100%    Last Pain:  Vitals:   08/26/22 1138  TempSrc:   PainSc: 7                  VAN STAVEREN,Aubrianne Molyneux

## 2022-08-26 NOTE — Transfer of Care (Signed)
Immediate Anesthesia Transfer of Care Note  Patient: Dana Willis  Procedure(s) Performed: ATTEMPTED HYSTEROSCOPY, DILATATION AND CURETTAGE (Vagina )  Patient Location: PACU  Anesthesia Type:General  Level of Consciousness: awake, drowsy, and patient cooperative  Airway & Oxygen Therapy: Patient Spontanous Breathing and Patient connected to face mask oxygen  Post-op Assessment: Report given to RN and Post -op Vital signs reviewed and stable  Post vital signs: Reviewed and stable  Last Vitals:  Vitals Value Taken Time  BP 151/60 08/26/22 1140  Temp 36.2 C 08/26/22 1138  Pulse 56 08/26/22 1142  Resp 10 08/26/22 1142  SpO2 100 % 08/26/22 1142  Vitals shown include unfiled device data.  Last Pain:  Vitals:   08/26/22 0846  TempSrc: Temporal  PainSc: 0-No pain      Patients Stated Pain Goal: 0 (08/26/22 0846)  Complications: No notable events documented.

## 2022-08-26 NOTE — Discharge Instructions (Signed)
AMBULATORY SURGERY  DISCHARGE INSTRUCTIONS   The drugs that you were given will stay in your system until tomorrow so for the next 24 hours you should not:  Drive an automobile Make any legal decisions Drink any alcoholic beverage   You may resume regular meals tomorrow.  Today it is better to start with liquids and gradually work up to solid foods.  You may eat anything you prefer, but it is better to start with liquids, then soup and crackers, and gradually work up to solid foods.   Please notify your doctor immediately if you have any unusual bleeding, trouble breathing, redness and pain at the surgery site, drainage, fever, or pain not relieved by medication.       Please contact your physician with any problems or Same Day Surgery at [phone], Monday through Friday 6 am to 4 pm, or [place] [facility] [place] Main number at [phone].  

## 2022-08-26 NOTE — Anesthesia Procedure Notes (Signed)
Procedure Name: Intubation Date/Time: 08/26/2022 10:47 AM  Performed by: Elmarie Mainland, CRNAPre-anesthesia Checklist: Patient identified, Emergency Drugs available, Suction available and Patient being monitored Patient Re-evaluated:Patient Re-evaluated prior to induction Oxygen Delivery Method: Circle system utilized Preoxygenation: Pre-oxygenation with 100% oxygen Induction Type: IV induction Ventilation: Mask ventilation without difficulty Laryngoscope Size: McGraph and 3 Grade View: Grade I Tube type: Oral Tube size: 6.5 mm Number of attempts: 1 Airway Equipment and Method: Stylet and Video-laryngoscopy Placement Confirmation: ETT inserted through vocal cords under direct vision, positive ETCO2 and breath sounds checked- equal and bilateral Secured at: 21 cm Tube secured with: Tape Dental Injury: Teeth and Oropharynx as per pre-operative assessment

## 2022-08-26 NOTE — Anesthesia Preprocedure Evaluation (Addendum)
Anesthesia Evaluation  Patient identified by MRN, date of birth, ID band Patient awake    Reviewed: Allergy & Precautions, NPO status , Patient's Chart, lab work & pertinent test results  History of Anesthesia Complications (+) PONV and history of anesthetic complications  Airway Mallampati: III  TM Distance: <3 FB Neck ROM: full    Dental  (+) Teeth Intact   Pulmonary neg pulmonary ROS, sleep apnea and Continuous Positive Airway Pressure Ventilation , former smoker   Pulmonary exam normal  + decreased breath sounds      Cardiovascular Exercise Tolerance: Good hypertension, Pt. on medications negative cardio ROS Normal cardiovascular exam Rhythm:Regular Rate:Normal     Neuro/Psych    Depression    Remote hx of hydrocephalus, VP shunt functioning negative neurological ROS  negative psych ROS   GI/Hepatic negative GI ROS, Neg liver ROS,GERD  Medicated,,  Endo/Other  negative endocrine ROSdiabetes, Type 1, Insulin Dependent  Morbid obesity  Renal/GU negative Renal ROS     Musculoskeletal   Abdominal  (+) + obese  Peds negative pediatric ROS (+)  Hematology negative hematology ROS (+) Blood dyscrasia, anemia   Anesthesia Other Findings Past Medical History: No date: Anxiety     Comment:  panic attacks No date: B12 deficiency anemia No date: Depression No date: Diabetes mellitus without complication (HCC)     Comment:  Type 2 No date: GERD (gastroesophageal reflux disease) 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 No date: Hydrocephalus (HCC)     Comment:  normal pressure No date: Hyperlipidemia No date: Hypertension No date: Infertility management No date: Insulin dependent type 2 diabetes mellitus (HCC) No date: Lumbar disc disorder     Comment:  compression No date: Motion sickness     Comment:  any moving vehicle No date: Neuropathy of both feet     Comment:  Pt attributes to DM No date: NPH (normal pressure  hydrocephalus) (HCC) No date: PONV (postoperative nausea and vomiting) No date: Right rotator cuff tear No date: Sleep apnea     Comment:  CPAP No date: Vertigo     Comment:  daily No date: Vitamin D deficiency disease  Past Surgical History: 02/01/2006: BRAIN SURGERY     Comment:  Shunt No date: BREAST FIBROADENOMA SURGERY 05/19/2015: CATARACT EXTRACTION W/PHACO; Right     Comment:  Procedure: CATARACT EXTRACTION PHACO AND INTRAOCULAR               LENS PLACEMENT (IOC) right eye;  Surgeon: Sherald Hess, MD;  Location: [facility];  Service:              Ophthalmology;  Laterality: Right;  DIABETIC - oral               meds CPAP LEAVE PT 1ST 06/23/2015: CATARACT EXTRACTION W/PHACO; Left     Comment:  Procedure: CATARACT EXTRACTION PHACO AND INTRAOCULAR               LENS PLACEMENT (IOC) left eye;  Surgeon: Sherald Hess, MD;  Location: [facility];  Service:              Ophthalmology;  Laterality: Left;  DIABETIC - oral meds 1991: CESAREAN SECTION No date: CHOLECYSTECTOMY 05/21/2022: COLONOSCOPY WITH PROPOFOL; N/A     Comment:  Procedure: COLONOSCOPY WITH PROPOFOL;  Surgeon:  Regis Bill, MD;  Location: [facility] END[facility];                Service: Endoscopy;  Laterality: N/A; No date: EYE SURGERY No date: HERNIA REPAIR No date: TUBAL LIGATION  BMI    Body Mass Index: 34.39 kg/m      Reproductive/Obstetrics negative OB ROS                             Anesthesia Physical Anesthesia Plan  ASA: 3  Anesthesia Plan: General   Post-op Pain Management:    Induction: Intravenous  PONV Risk Score and Plan: 1 and Ondansetron and Dexamethasone  Airway Management Planned: LMA  Additional Equipment:   Intra-op Plan:   Post-operative Plan: Extubation in OR  Informed Consent: I have reviewed the patients History and Physical, chart, labs and discussed the  procedure including the risks, benefits and alternatives for the proposed anesthesia with the patient or authorized representative who has indicated his/her understanding and acceptance.     Dental Advisory Given  Plan Discussed with: CRNA and Surgeon  Anesthesia Plan Comments:        Anesthesia Quick Evaluation

## 2022-08-26 NOTE — Interval H&P Note (Signed)
History and Physical Interval Note:  08/26/2022 10:32 AM  Dana Willis  has presented today for surgery, with the diagnosis of Postmenopausal bleeding, endometrial polyp.  The various methods of treatment have been discussed with the patient and family. After consideration of risks, benefits and other options for treatment, the patient has consented to  Procedure(s): DILATATION AND CURETTAGE /HYSTEROSCOPY/ ENDOMETRIAL POLYPECTOMY (N/A) as a surgical intervention.  The patient's history has been reviewed, patient examined, no change in status, stable for surgery.  I have reviewed the patient's chart and labs.  Questions were answered to the patient's satisfaction.    Thomasene Mohair, MD, [facility] 08/26/2022 10:32 AM

## 2022-08-27 ENCOUNTER — Encounter: Payer: Self-pay | Admitting: Obstetrics and Gynecology

## 2022-10-06 ENCOUNTER — Inpatient Hospital Stay: Payer: Medicare Other | Attending: Obstetrics and Gynecology | Admitting: Obstetrics and Gynecology

## 2022-10-06 VITALS — BP 140/71 | HR 57 | Temp 98.9°F | Resp 18 | Wt 181.5 lb

## 2022-10-06 DIAGNOSIS — I10 Essential (primary) hypertension: Secondary | ICD-10-CM | POA: Insufficient documentation

## 2022-10-06 DIAGNOSIS — K219 Gastro-esophageal reflux disease without esophagitis: Secondary | ICD-10-CM | POA: Diagnosis not present

## 2022-10-06 DIAGNOSIS — R1084 Generalized abdominal pain: Secondary | ICD-10-CM

## 2022-10-06 DIAGNOSIS — E669 Obesity, unspecified: Secondary | ICD-10-CM | POA: Diagnosis not present

## 2022-10-06 DIAGNOSIS — E782 Mixed hyperlipidemia: Secondary | ICD-10-CM | POA: Insufficient documentation

## 2022-10-06 DIAGNOSIS — Z794 Long term (current) use of insulin: Secondary | ICD-10-CM | POA: Diagnosis not present

## 2022-10-06 DIAGNOSIS — Z7189 Other specified counseling: Secondary | ICD-10-CM

## 2022-10-06 DIAGNOSIS — M549 Dorsalgia, unspecified: Secondary | ICD-10-CM | POA: Diagnosis not present

## 2022-10-06 DIAGNOSIS — Z7982 Long term (current) use of aspirin: Secondary | ICD-10-CM | POA: Insufficient documentation

## 2022-10-06 DIAGNOSIS — G912 (Idiopathic) normal pressure hydrocephalus: Secondary | ICD-10-CM | POA: Diagnosis not present

## 2022-10-06 DIAGNOSIS — Z6834 Body mass index (BMI) 34.0-34.9, adult: Secondary | ICD-10-CM | POA: Diagnosis not present

## 2022-10-06 DIAGNOSIS — B009 Herpesviral infection, unspecified: Secondary | ICD-10-CM | POA: Diagnosis not present

## 2022-10-06 DIAGNOSIS — Z87891 Personal history of nicotine dependence: Secondary | ICD-10-CM | POA: Diagnosis not present

## 2022-10-06 DIAGNOSIS — R9389 Abnormal findings on diagnostic imaging of other specified body structures: Secondary | ICD-10-CM | POA: Diagnosis not present

## 2022-10-06 DIAGNOSIS — Z7984 Long term (current) use of oral hypoglycemic drugs: Secondary | ICD-10-CM | POA: Diagnosis not present

## 2022-10-06 DIAGNOSIS — F32A Depression, unspecified: Secondary | ICD-10-CM | POA: Insufficient documentation

## 2022-10-06 DIAGNOSIS — E119 Type 2 diabetes mellitus without complications: Secondary | ICD-10-CM | POA: Insufficient documentation

## 2022-10-06 DIAGNOSIS — N840 Polyp of corpus uteri: Secondary | ICD-10-CM | POA: Insufficient documentation

## 2022-10-06 DIAGNOSIS — N950 Postmenopausal bleeding: Secondary | ICD-10-CM | POA: Insufficient documentation

## 2022-10-06 DIAGNOSIS — F419 Anxiety disorder, unspecified: Secondary | ICD-10-CM | POA: Diagnosis not present

## 2022-10-06 DIAGNOSIS — Z79899 Other long term (current) drug therapy: Secondary | ICD-10-CM | POA: Insufficient documentation

## 2022-10-06 DIAGNOSIS — N83209 Unspecified ovarian cyst, unspecified side: Secondary | ICD-10-CM

## 2022-10-06 NOTE — Progress Notes (Signed)
Gynecologic Oncology Consult Visit   Referring Provider: Dr Jean Rosenthal  Chief Complaint: thickened endometrium and polyp  Subjective:  Dana Willis is a 67 y.o. female who is seen in consultation from Dr. Jean Rosenthal for thickened endometrial and endometrial polyp.   Patient presented to Dr Jean Rosenthal for PMB, found to have thickened endometrium and endometrial polyp. 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   08/03/22 US Pelvic & transvaginal Uterus: 67 mm x 45 mm x 37 mm. Normal. Anteflexed.  - Myometrium: Heterogeneous, focal thickening. Endometrium measuring 9.6 mm.  Cervix details: nabothian cysts visualized  Fibroid(s) Possible necrotic. 23 mm x 17 mm x 18 mm. Mean 19.3 mm. Vol 3.7 cm. FIGO - 6 (subserosal but < 50% intramural). Left lateral posterior wall  Polyp(s)   Size 12 mm x 8 mm x 9 mm. Mean 9.7 mm. Smooth   Right Ovary  - Visualized, slightly prominent. Outline: Normal. Morphology: Appropriate. Size 25 mm x 24 mm x 19 mm. - Follicles identified.  - Cyst(s)    Size 22.0 mm x 20 mm x 17 mm. Mean 19.7 mm. Vol 3.917 cm. Complex; septation= 0.11cm   Left Ovary  - Visualized, Normal. Outline: Normal. Morphology: Appropriate. Size 24 mm x 10 mm x 13 mm  - No cysts identified  - Follicles identified  Cul de Sac:  - Normal. Anechoic. Free fluid visualized: mild   Impression  - Endovaginal ultrasound performed today due to the indications outlined above.  - The sonogram reveals an anteflexed heterogeneous uterus with possible necrotic fibroid.  - The endometrium is focally thickened at 9.6 mm with possible polyp as described above.  - Right ovary contains a 22 mm follicle as described above.   She has not had additional bleeding. Reports pelvic pressure and heaviness. New right back pain that radiates to the pelvis. She has history of OCP use and fertility treatments. Hx of c section. No HRT.  No unintentional weight loss. No changes to bowel or bladder.   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   NPH (normal pressure hydrocephalus) (HCC) 03/03/2016   Pure hypercholesterolemia 03/03/2016    Past Medical History: Past Medical History:  Diagnosis Date   Anxiety    panic attacks   B12 deficiency anemia    Depression    Diabetes mellitus without complication (HCC)    Type 2   GERD (gastroesophageal reflux disease)    HSV-2 infection    Hydrocephalus (HCC)    normal pressure   Hyperlipidemia    Hypertension    Infertility management    Insulin dependent type 2 diabetes mellitus (HCC)    Lumbar disc disorder    compression   Motion sickness    any moving vehicle   Neuropathy of both feet    Pt attributes to DM   NPH (normal pressure hydrocephalus) (HCC)    PONV (postoperative nausea and vomiting)    Right rotator cuff  tear    Sleep apnea    CPAP   Vertigo    daily   Vitamin D deficiency disease     Past Surgical History: Past Surgical History:  Procedure Laterality Date   BRAIN SURGERY  02/01/2006   Shunt   BREAST FIBROADENOMA SURGERY     CATARACT EXTRACTION W/PHACO Right 05/19/2015   Procedure: CATARACT EXTRACTION PHACO AND INTRAOCULAR LENS PLACEMENT (IOC) right eye;  Surgeon: Sherald Hess, MD;  Location: [facility];  Service: Ophthalmology;  Laterality: Right;  DIABETIC - oral meds CPAP LEAVE PT  1ST   CATARACT EXTRACTION W/PHACO Left 06/23/2015   Procedure: CATARACT EXTRACTION PHACO AND INTRAOCULAR LENS PLACEMENT (IOC) left eye;  Surgeon: Sherald Hess, MD;  Location: [facility];  Service: Ophthalmology;  Laterality: Left;  DIABETIC - oral meds   CESAREAN SECTION  1991   CHOLECYSTECTOMY     COLONOSCOPY WITH PROPOFOL N/A 05/21/2022   Procedure: COLONOSCOPY WITH PROPOFOL;  Surgeon: Regis Bill, MD;  Location: [facility];  Service: Endoscopy;  Laterality: N/A;   EYE SURGERY     HERNIA REPAIR     HYSTEROSCOPY WITH D & C N/A 08/26/2022   Procedure: ATTEMPTED HYSTEROSCOPY, DILATATION AND CURETTAGE;  Surgeon: Conard Novak, MD;  Location: [facility] OR[facility];  Service: Gynecology;  Laterality: N/A;  Dilation and Attemtped Hysteroscopy performed; Polypectomy and Curretage aborted by surgeon   TUBAL LIGATION      Past Gynecologic History:  Menarche: age 17 Menstrual details: irregular.  Post menopausal.  History of OCP Hx of herpes.  Took fertility treatments.      OB History:  G1P1001; C section. Son is 57 year old.  OB History  No obstetric history on file.    Family History: Family History  Problem Relation Age of Onset   Breast cancer Neg Hx     Social History: Social History   Socioeconomic History   Marital status: Married    Spouse name: Deniece Portela   Number of children: 1   Years of education: Not on file   Highest education level: Not on file  Occupational History   Not on file  Tobacco Use   Smoking status: Former    Current packs/day: 0.00    Types: Cigarettes    Quit date: 02/02/1983    Years since quitting: 39.7   Smokeless tobacco: Never  Vaping Use   Vaping status: Never Used  Substance and Sexual Activity   Alcohol use: Yes    Alcohol/week: 1.0 standard drink of alcohol    Types: 1 Glasses of wine per week   Drug use: Never   Sexual activity: Not on file  Other Topics Concern   Not on file  Social History Narrative    Not on file   Social Determinants of Health   Financial Resource Strain: Not on file  Food Insecurity: Not on file  Transportation Needs: Not on file  Physical Activity: Not on file  Stress: Not on file  Social Connections: Not on file  Intimate Partner Violence: Not on file    Allergies: No Known Allergies  Current Medications: Current Outpatient Medications  Medication Sig Dispense Refill   acyclovir (ZOVIRAX) 400 MG tablet Take 400 mg by mouth 2 (two) times daily.     ALPRAZolam (XANAX) 0.25 MG tablet Take 0.25 mg by mouth as needed for anxiety.     Ascorbic Acid (VITAMIN C) 1000 MG tablet Take 1,000 mg by mouth daily.     aspirin 81  MG tablet Take 81 mg by mouth daily.     Calcium Carbonate (CALCIUM 600 PO) Take by mouth 2 (two) times daily.     Cholecalciferol 25 MCG (1000 UT) tablet Take by mouth.     dapagliflozin propanediol (FARXIGA) 10 MG TABS tablet Take 1 tablet by mouth every morning.     doxazosin (CARDURA) 4 MG tablet Take 4 mg by mouth every morning.     ELDERBERRY PO Take 550 mg by mouth 2 (two) times daily.     hydrochlorothiazide (HYDRODIURIL) 25 MG tablet Take 25 mg by mouth daily.     ibuprofen (ADVIL) 600 MG tablet Take 1 tablet (600 mg total) by mouth every 6 (six) hours as needed for mild pain, cramping or moderate pain. 30 tablet 0   lisinopril (ZESTRIL) 20 MG tablet Take 20 mg by mouth daily.     metFORMIN (GLUCOPHAGE) 1000 MG tablet Take 1 tablet by mouth daily.     metoprolol (LOPRESSOR) 50 MG tablet Take 50 mg by mouth daily.     Multiple Vitamins-Minerals (MULTIVITAMIN ADULT PO) Take by mouth daily.     omega-3 acid ethyl esters (LOVAZA) 1 g capsule Take by mouth 2 (two) times daily.     pantoprazole (PROTONIX) 40 MG tablet Take 40 mg by mouth daily.     PARoxetine (PAXIL) 10 MG tablet Take 10 mg by mouth at bedtime.     pregabalin (LYRICA) 200 MG capsule Take 200 mg by mouth 2 (two) times daily.     Probiotic, Lactobacillus, CAPS Take by mouth.      rosuvastatin (CRESTOR) 20 MG tablet Take 20 mg by mouth daily.     Semaglutide, 1 MG/DOSE, (OZEMPIC, 1 MG/DOSE,) 4 MG/3ML SOPN Inject 0.75 mLs into the skin once a week. Saturday     TRESIBA FLEXTOUCH 200 UNIT/ML FlexTouch Pen Inject 90 Units into the skin at bedtime.     HYDROcodone-acetaminophen (NORCO/VICODIN) 5-325 MG tablet Take 1 tablet by mouth every 8 (eight) hours as needed (breakthrough pain). (Patient not taking: Reported on 10/06/2022) 8 tablet 0   No current facility-administered medications for this visit.    Review of Systems General: negative for fevers, changes in weight or night sweats Skin: negative for changes in moles or sores or rash Eyes: negative for changes in vision HEENT: negative for voice changes. Positive for tinnitus and hearing loss (chronic) Pulmonary: negative for dyspnea, orthopnea, productive cough, wheezing Cardiac: negative for palpitations, pain Gastrointestinal: negative for nausea, vomiting, constipation, diarrhea, hematemesis, hematochezia Genitourinary/Sexual: negative for dysuria, retention, hematuria, incontinence Ob/Gyn: positive for pelvic pressure & bleeding  Musculoskeletal: negative for pain, joint pain. Positive for back pain on right that radiates to pelvis Hematology: negative for easy bruising, abnormal bleeding Neurologic/Psych: negative for headaches, seizures, paralysis, weakness, numbness  Objective:  Physical Examination:  BP (!) 140/71   Pulse (!) 57   Temp 98.9 F (37.2 C)   Resp 18   Wt 181 lb 8 oz (82.3 kg)   SpO2 96%   BMI 34.29 kg/m     ECOG Performance Status: 1 - Symptomatic but completely ambulatory  GENERAL: Patient is a well appearing female in no acute distress HEENT:  Sclerae anicteric.  Oropharynx clear and moist. No ulcerations or evidence of oropharyngeal candidiasis. Neck is supple.  NODES:  No cervical, supraclavicular, or axillary lymphadenopathy palpated.  LUNGS:  Clear to auscultation bilaterally.   No wheezes or rhonchi. HEART:  Regular rate and rhythm. No murmur appreciated. ABDOMEN:  Soft, nontender.  Positive, normoactive bowel sounds. No organomegaly palpated. Abdominal hernia present.  MSK:  No focal spinal tenderness to palpation. Full range of motion bilaterally in the upper extremities. EXTREMITIES:  No peripheral edema.   SKIN:  Clear with no obvious rashes or skin changes.  NEURO:  Nonfocal. Well oriented.  Appropriate affect.  Pelvic: Exam Chaperoned by CMA EGBUS: no lesions Cervix: cervix  Vagina: no lesions, no discharge or bleeding Uterus: normal size, nontender, mobile Adnexa: no palpable masses Rectovaginal: confirmatory   Lab Review No labs on site today  Radiologic Imaging: No imaging on site    Assessment:  Dana Willis is a 67 y.o. female diagnosed with thickened endometrial and endometrial polyp underwent attempted hysteroscopy D&C that was abandoned due to cervical stenosis and uterine perforation on 08/26/22.   Possible recurrence umbilical hernia s/p prior hernia repair with mesh.  Medical co-morbidities complicating care: Sleep apnea; Hydrocephalus; GERD; HTN; Insulin dependent type 2 diabetes mellitus; Body mass index is 34.29 kg/m.  Plan:   Problem List Items Addressed This Visit       Other   Postmenopausal bleeding - Primary   Other Visit Diagnoses     Cyst of ovary, unspecified laterality       Counseling and coordination of care       Generalized abdominal pain       Relevant Orders   CT ABDOMEN PELVIS W CONTRAST       We discussed options for management including and  recommended surgery. There are several options. Dr. Jean Rosenthal can reattempt hysteroscopy vs proceeding with TH/BSO. We can perform SLN injection with mapping and then biopsies with intraoperative pathology c/w malignancy. She was consented for SLN injection with mapping and then nodal biopsies if indicated, possible biopsies, omentectomy, pelvic and para-aortic node  dissection.   With regard to the hernia obtain CT A/P to assess for recurrence. If recurrent, recommended consultation with [facility].  Suggested return to clinic in  4 weeks if malignancy.   The patient's diagnosis, an outline of the further diagnostic and laboratory studies which will be required, the recommendation for surgery, and alternatives were discussed with her and her accompanying family members.  All questions were answered to their satisfaction.  Risks were discussed in detail. These include infection, anesthesia, bleeding, transfusion, wound separation, vaginal cuff dehiscence, medical issues (blood clots, stroke, heart attack, fluid in the lungs, pneumonia, abnormal heart rhythm, death), possible exploratory surgery with larger incision, lymphedema, lymphocyst, allergic reaction, injury to adjacent organs (bowel, bladder, blood vessels, nerves, ureters, uterus)   A total of 80 minutes were spent with the patient/family today; 50% was spent in education, counseling and coordination of care for thickened endometrial and endometrial polyp underwent attempted hysteroscopy D&C that was abandoned due to cervical stenosis and uterine perforation on 08/26/22.   Possible recurrence umbilical hernia s/p prior hernia repair with mesh.  I personally had a face to face interaction and evaluated the patient jointly with the NP, Ms. Consuello Masse.  I have reviewed her history and available records and have performed the key portions of the physical exam including lymph node survey, abdominal exam, pelvic exam with my findings confirming those documented above by the APP.  I have discussed the case with the APP and the patient.  I agree with the above documentation, assessment and plan which was fully formulated by me.  Counseling was completed by me.   I personally saw the patient and performed a substantive portion of this encounter in conjunction with  the listed APP as documented above.  Angeles Leta Jungling, MD

## 2022-10-13 ENCOUNTER — Ambulatory Visit
Admission: RE | Admit: 2022-10-13 | Discharge: 2022-10-13 | Disposition: A | Discharge status: Home / Self Care | Payer: Medicare Other | Source: Ambulatory Visit | Attending: Nurse Practitioner | Admitting: Nurse Practitioner

## 2022-10-13 DIAGNOSIS — R1084 Generalized abdominal pain: Secondary | ICD-10-CM | POA: Insufficient documentation

## 2022-10-13 MED ORDER — IOHEXOL 300 MG/ML  SOLN
100.0000 mL | Freq: Once | INTRAMUSCULAR | Status: AC | PRN
  Administered 2022-10-13: 100 mL via INTRAVENOUS

## 2022-10-29 ENCOUNTER — Telehealth: Payer: Self-pay

## 2022-10-29 NOTE — Telephone Encounter (Signed)
Called and provided results of CT scan. No documentation of recurrent umbilical hernia. Dr. Edison Pace office notified regarding scheduling surgery.

## 2022-11-15 NOTE — H&P (Signed)
Preoperative History and Physical  Chief Complaint: Dana Willis is a 67 y.o. G1P1001 here for surgical management of postmenopausal bleeding, thickened endometrium, endometrial polyp.   No significant preoperative concerns.  History of Present Illness: Patient presented for PMB, found to have thickened endometrium and endometrial polyp. 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    08/03/22 US Pelvic & transvaginal Uterus: 67 mm x 45 mm x 37 mm. Normal. Anteflexed.  - Myometrium: Heterogeneous, focal thickening. Endometrium measuring 9.6 mm.  Cervix details: nabothian cysts visualized  Fibroid(s) Possible necrotic. 23 mm x 17 mm x 18 mm. Mean 19.3 mm. Vol 3.7 cm. FIGO - 6 (subserosal but < 50% intramural). Left lateral posterior wall  Polyp(s)   Size 12 mm x 8 mm x 9 mm. Mean 9.7 mm. Smooth   Right Ovary  - Visualized, slightly prominent. Outline: Normal. Morphology: Appropriate. Size 25 mm x 24 mm x 19 mm. - Follicles identified.  - Cyst(s)    Size 22.0 mm x 20 mm x 17 mm. Mean 19.7 mm. Vol 3.917 cm. Complex; septation= 0.11cm   Left Ovary  - Visualized, Normal. Outline: Normal. Morphology: Appropriate. Size 24 mm x 10 mm x 13 mm  - No cysts identified  - Follicles identified  Cul de Sac:  - Normal. Anechoic. Free fluid visualized: mild   Impression  - Endovaginal ultrasound performed today due to the indications outlined above.  - The sonogram reveals an anteflexed heterogeneous uterus with possible necrotic fibroid.  - The endometrium is focally thickened at 9.6 mm with possible polyp as described above.  - Right ovary contains a 22 mm follicle as described above.    She has not had additional bleeding. Reports pelvic pressure and heaviness. New right back pain that radiates to the pelvis. She has history of OCP use and fertility treatments. Hx of c section. No HRT. No unintentional weight  loss. No changes to bowel or bladder.   Proposed surgery: Robot assisted total laparoscopic hysterectomy, bilateral salpingecto-oophorectomy, cystoscopy, Sentinel lymph node injection with mapping and then biopsies with intraoperative pathology c/w malignancy. With Gyn Onc, she was consented for SLN injection with mapping and then nodal biopsies if indicated, possible biopsies, omentectomy, pelvic and para-aortic node dissection.   Past Medical History:  Diagnosis Date   Anxiety    B12 deficiency anemia    GERD (gastroesophageal reflux disease)    HSV-2 infection    Hyperlipidemia    Hypertension    Infertility management    NPH (normal pressure hydrocephalus) (CMS/HHS-HCC)    Sleep apnea    Type 2 diabetes mellitus (CMS/HHS-HCC)    Insulin started 01/2016   Vitamin D deficiency disease    Past Surgical History:  Procedure Laterality Date   vp shunt  2008   hydrocephalus - in [place]   Colon @ [facility]  05/21/2022   Colonoscopy unremarkable. Repeat in 10 years/CTL   CESAREAN SECTION     CHOLECYSTECTOMY     GYNECOLOGIC CRYOSURGERY  1987   HERNIA REPAIR     OTHER SURGERY     CRYOSURGERY   TUBAL LIGATION     OB History  Gravida Para Term Preterm AB Living  1 1 1     1   SAB IAB Ectopic Molar Multiple Live Births            1    # Outcome Date GA Lbr Len/2nd Weight Sex Type Anes PTL  Lv  1 Term     M CS-LTranv   LIV     Complications: Breech presentation (HHS-HCC), Prolapsed cord (HHS-HCC)  Patient denies any other pertinent gynecologic issues.   Current Outpatient Medications on File Prior to Visit  Medication Sig Dispense Refill   acyclovir (ZOVIRAX) 400 MG tablet TAKE 1 TABLET TWICE A DAY 180 tablet 3   ALPRAZolam (XANAX) 0.25 MG tablet Take 1 tablet (0.25 mg total) by mouth once daily as needed for Sleep 30 tablet 0   aspirin 81 MG EC tablet Take 81 mg by mouth once daily.     calcium carbonate-vitamin D3 (CALTRATE 600+D) 600 mg(1,500mg ) -200 unit tablet Take 1 tablet by  mouth 2 (two) times daily with meals.     cholecalciferol (VITAMIN D3) 1000 unit tablet Take 1,000 Units by mouth once daily.       cinnamon bark (CINNAMON ORAL) Take 1 tablet by mouth 2 (two) times daily     dapagliflozin propanediol (FARXIGA) 10 mg tablet Take 1 tablet (10 mg total) by mouth every morning 90 tablet 3   DEXCOM G7 SENSOR Devi Use 1 each every 10 (ten) days 3 each 12   dicyclomine (BENTYL) 10 mg capsule Take 10 mg by mouth 4 (four) times daily as needed     doxazosin (CARDURA) 4 MG tablet TAKE 1 TABLET NIGHTLY 90 tablet 3   doxazosin (CARDURA) 4 MG tablet Take by mouth     ELDERBERRY FRUIT ORAL Take by mouth 2 (two) times daily.       hydroCHLOROthiazide (HYDRODIURIL) 25 MG tablet TAKE 1 TABLET DAILY 90 tablet 3   ibuprofen (MOTRIN) 600 MG tablet Take by mouth     insulin DEGLUDEC (TRESIBA FLEXTOUCH U-200) pen injector (concentration 200 units/mL) INJECT 90 UNITS            SUBCUTANEOUSLY DAILY 45 mL 3   insulin DEGLUDEC (TRESIBA FLEXTOUCH U-200) pen injector (concentration 200 units/mL) Inject subcutaneously     Lactobacillus acidophilus 10 billion cell Cap Take by mouth once daily     lisinopriL (ZESTRIL) 20 MG tablet Take 1 tablet (20 mg total) by mouth once daily 90 tablet 3   metFORMIN (GLUCOPHAGE) 1000 MG tablet TAKE 1 TABLET DAILY 90 tablet 3   metoprolol tartrate (LOPRESSOR) 50 MG tablet take 1 tablet daily 90 tablet 3   multivitamin tablet Take 1 tablet by mouth once daily     omega-3 acid ethyl esters (LOVAZA) 1 gram capsule Take 2 capsules (2 g total) by mouth 2 (two) times daily 360 capsule 3   pantoprazole (PROTONIX) 40 MG DR tablet TAKE 1 TABLET DAILY 90 tablet 3   PARoxetine (PAXIL) 10 MG tablet TAKE 1 TABLET ONCE DAILY 90 tablet 3   pregabalin (LYRICA) 200 MG capsule TAKE 1 CAPSULE TWICE A DAY 180 capsule 1   rosuvastatin (CRESTOR) 20 MG tablet take 1 tablet daily 90 tablet 1   semaglutide (OZEMPIC) 2 mg/dose (8 mg/3 mL) pen injector Inject 2 mg subcutaneously  on the same day each week. 9 mL 3   TURMERIC ORAL Take 1 capsule by mouth once daily     ACCU-CHEK GUIDE GLUCOSE METER Misc Use daily to check blood sugars (Patient not taking: Reported on 11/15/2022) 1 each 0   ascorbic acid, vitamin C, (VITAMIN C) 1000 MG tablet Take 1,000 mg by mouth once daily. (Patient not taking: Reported on 11/15/2022)     BD NANO 2ND GEN PEN NEEDLE 32 gauge x 5/32" Ndle USE AND  DISCARD 1 PEN NEEDLE DAILY. (Patient not taking: Reported on 11/15/2022) 90 each 3   blood glucose diagnostic (ACCU-CHEK GUIDE TEST STRIPS) test strip Use 3 times daily (Patient not taking: Reported on 11/15/2022) 300 strip 3   HYDROcodone-acetaminophen (NORCO) 5-325 mg tablet Take by mouth (Patient not taking: Reported on 11/15/2022)     semaglutide (OZEMPIC) 1 mg/dose (4 mg/3 mL) pen injector Inject subcutaneously (Patient not taking: Reported on 11/15/2022)     No current facility-administered medications on file prior to visit.   No Known Allergies  Social History:   reports that she quit smoking about 39 years ago. Her smoking use included cigarettes. She started smoking about 52 years ago. She has a 12.9 pack-year smoking history. She has never used smokeless tobacco. She reports current alcohol use of about 1.0 standard drink of alcohol per week. She reports that she does not use drugs.  Family History  Problem Relation Name Age of Onset   Diabetes type II Mother Ollen Gross    High blood pressure (Hypertension) Mother Ollen Gross    Diabetes Mother Ollen Gross    Diabetes type II Father Merrill    High blood pressure (Hypertension) Father Margot Ables    Coronary Artery Disease (Blocked arteries around heart) Father Margot Ables    Stroke Father Margot Ables    Diabetes Father Merrill    High blood pressure (Hypertension) Brother Richard    Diabetes Brother Richard    Cancer Brother Gerlene Burdock        had a tumor attached to the outside of his lung.    Review of Systems: Noncontributory  PHYSICAL  EXAM: Blood pressure 110/69, pulse 73, height 152.4 cm (5'), weight 82.6 kg (182 lb 3.2 oz). CONSTITUTIONAL: Well-developed, well-nourished female in no acute distress.  HENT:  Normocephalic, atraumatic, External right and left ear normal. Oropharynx is clear and moist EYES: Conjunctivae and EOM are normal. Pupils are equal, round, and reactive to light. No scleral icterus.  NECK: Normal range of motion, supple, no masses SKIN: Skin is warm and dry. No rash noted. Not diaphoretic. No erythema. No pallor. NEUROLGIC: Alert and oriented to person, place, and time. Normal reflexes, muscle tone coordination. No cranial nerve deficit noted. PSYCHIATRIC: Normal mood and affect. Normal behavior. Normal judgment and thought content. CARDIOVASCULAR: Normal heart rate noted, regular rhythm RESPIRATORY: Effort and breath sounds normal, no problems with respiration noted ABDOMEN: Soft, nontender, nondistended. PELVIC: Deferred MUSCULOSKELETAL: Normal range of motion. No edema and no tenderness. 2+ distal pulses.  Labs: No results found for this or any previous visit (from the past 2 weeks).  Imaging Studies: No results found.  Assessment: 1. PMB (postmenopausal bleeding)   2. Endometrial polyp   3. Thickened endometrium     Plan: Patient will undergo surgical management with the above-noted surgeyr.   The risks of surgery were discussed in detail with the patient including but not limited to: bleeding which may require transfusion or reoperation; infection which may require antibiotics; injury to surrounding organs which may involve bowel, bladder, ureters ; need for additional procedures including laparoscopy or laparotomy; thromboembolic phenomenon, surgical site problems and other postoperative/anesthesia complications. Likelihood of success in alleviating the patient's condition was discussed. Routine postoperative instructions will be reviewed with the patient and her family in detail after  surgery.  The patient concurred with the proposed plan, giving informed written consent for the surgery.   Preoperative prophylactic antibiotics, as indicated, and SCDs ordered on call to the OR.   Hold ozempic week before surgery  Attestation Statement:   I personally performed the service. (TP)  Malaika Arnall Teola Bradley, MD  [facility] OB/GYN [facility] 11/15/2022 10:36 AM

## 2022-11-16 ENCOUNTER — Encounter
Admission: RE | Admit: 2022-11-16 | Discharge: 2022-11-16 | Disposition: A | Discharge status: Home / Self Care | Payer: Medicare Other | Source: Ambulatory Visit | Attending: Obstetrics and Gynecology | Admitting: Obstetrics and Gynecology

## 2022-11-16 ENCOUNTER — Other Ambulatory Visit: Payer: Self-pay

## 2022-11-16 VITALS — Ht 61.0 in | Wt 177.0 lb

## 2022-11-16 DIAGNOSIS — E1142 Type 2 diabetes mellitus with diabetic polyneuropathy: Secondary | ICD-10-CM

## 2022-11-16 HISTORY — DX: Shortness of breath: R06.02

## 2022-11-16 HISTORY — DX: Tachycardia, unspecified: R00.0

## 2022-11-16 HISTORY — DX: Other forms of angina pectoris: I20.89

## 2022-11-16 NOTE — Patient Instructions (Addendum)
Your procedure is scheduled on: Wednesday October 23. Report to the Registration Desk on the 1st floor of the [facility]. To find out your arrival time, please call [phone] between 1PM - 3PM on: Tuesday October 22. If your arrival time is 6:00 am, do not arrive before that time as the [facility] entrance doors do not open until 6:00 am.  REMEMBER: Instructions that are not followed completely may result in serious medical risk, up to and including death; or upon the discretion of your surgeon and anesthesiologist your surgery may need to be rescheduled.  Do not eat food after midnight the night before surgery.  No gum chewing or hard candies.   One week prior to surgery: Stop Anti-inflammatories (NSAIDS) such as Advil, Aleve, Ibuprofen, Motrin, Naproxen, Naprosyn and Aspirin based products such as Excedrin, Goody's Powder, BC Powder. Stop ALL OVER THE COUNTER supplements and vitamins until after surgery.   Stop Semaglutide, 1 MG/DOSE, (OZEMPIC, 1 MG/DOSE,) 4 MG/3ML SOPN 7 days prior to your surgery Stop metFORMIN (GLUCOPHAGE) 1000 MG 2 days prior to your surgery (take last dose Sunday 11/21/22) Stop dapagliflozin propanediol (FARXIGA) 10 MG TABS 3 days prior to your surgery (take last dose Saturday 11/20/22) Stop aspirin 81 MG 7 days prior to your surgery  You may however, continue to take Tylenol if needed for pain up until the day before your surgery.  Take only 1/2 of your normal TRESIBA FLEXTOUCH 200 UNIT/ML insulin the night before your surgery (take 45 units)  Continue taking all of your other prescription medications up until the day of surgery.  ON THE DAY OF SURGERY ONLY TAKE THESE MEDICATIONS WITH SIPS OF WATER:  doxazosin (CARDURA) 4 MG  metoprolol (LOPRESSOR) 50 MG  pantoprazole (PROTONIX) 40 MG  pregabalin (LYRICA) 200 MG  rosuvastatin (CRESTOR) 20 MG    No Alcohol for 24 hours before or after surgery.  No Smoking including e-cigarettes for 24 hours  before surgery.  No chewable tobacco products for at least 6 hours before surgery.  No nicotine patches on the day of surgery.  Do not use any "recreational" drugs for at least a week (preferably 2 weeks) before your surgery.  Please be advised that the combination of cocaine and anesthesia may have negative outcomes, up to and including death. If you test positive for cocaine, your surgery will be cancelled.  On the morning of surgery brush your teeth with toothpaste and water, you may rinse your mouth with mouthwash if you wish. Do not swallow any toothpaste or mouthwash.  Use CHG Soap or wipes as directed on instruction sheet.  Do not wear jewelry, make-up, hairpins, clips or nail polish.  For welded (permanent) jewelry: bracelets, anklets, waist bands, etc.  Please have this removed prior to surgery.  If it is not removed, there is a chance that hospital personnel will need to cut it off on the day of surgery.  Do not wear lotions, powders, or perfumes.   Do not shave body hair from the neck down 48 hours before surgery.  Contact lenses, hearing aids and dentures may not be worn into surgery.  Do not bring valuables to the hospital. [facility] is not responsible for any missing/lost belongings or valuables.   Bring your C-PAP to the hospital in case you may have to spend the night.   Notify your doctor if there is any change in your medical condition (cold, fever, infection).  Wear comfortable clothing (specific to your surgery type) to the hospital.  After surgery, you can help prevent lung complications by doing breathing exercises.  Take deep breaths and cough every 1-2 hours. Your doctor may order a device called an Incentive Spirometer to help you take deep breaths. When coughing or sneezing, hold a pillow firmly against your incision with both hands. This is called "splinting." Doing this helps protect your incision. It also decreases belly discomfort.  If you are being  admitted to the hospital overnight, leave your suitcase in the car. After surgery it may be brought to your room.  In case of increased patient census, it may be necessary for you, the patient, to continue your postoperative care in the Same Day Surgery department.  If you are being discharged the day of surgery, you will not be allowed to drive home. You will need a responsible individual to drive you home and stay with you for 24 hours after surgery.   If you are taking public transportation, you will need to have a responsible individual with you.  Please call the Pre-admissions Testing Dept. at [phone] if you have any questions about these instructions.  Surgery Visitation Policy:  Patients having surgery or a procedure may have two visitors.  Children under the age of 60 must have an adult with them who is not the patient.  Inpatient Visitation:    Visiting hours are 7 a.m. to 8 p.m. Up to four visitors are allowed at one time in a patient room. The visitors may rotate out with other people during the day.  One visitor age 26 or older may stay with the patient overnight and must be in the room by 8 p.m.    Preparing for Surgery with CHLORHEXIDINE GLUCONATE (CHG) Soap  Chlorhexidine Gluconate (CHG) Soap  o An antiseptic cleaner that kills germs and bonds with the skin to continue killing germs even after washing  o Used for showering the night before surgery and morning of surgery  Before surgery, you can play an important role by reducing the number of germs on your skin.  CHG (Chlorhexidine gluconate) soap is an antiseptic cleanser which kills germs and bonds with the skin to continue killing germs even after washing.  Please do not use if you have an allergy to CHG or antibacterial soaps. If your skin becomes reddened/irritated stop using the CHG.  1. Shower the NIGHT BEFORE SURGERY and the MORNING OF SURGERY with CHG soap.  2. If you choose to wash your hair,  wash your hair first as usual with your normal shampoo.  3. After shampooing, rinse your hair and body thoroughly to remove the shampoo.  4. Use CHG as you would any other liquid soap. You can apply CHG directly to the skin and wash gently with a scrungie or a clean washcloth.  5. Apply the CHG soap to your body only from the neck down. Do not use on open wounds or open sores. Avoid contact with your eyes, ears, mouth, and genitals (private parts). Wash face and genitals (private parts) with your normal soap.  6. Wash thoroughly, paying special attention to the area where your surgery will be performed.  7. Thoroughly rinse your body with warm water.  8. Do not shower/wash with your normal soap after using and rinsing off the CHG soap.  9. Pat yourself dry with a clean towel.  10. Wear clean pajamas to bed the night before surgery.  12. Place clean sheets on your bed the night of your first shower and do not  sleep with pets.  13. Shower again with the CHG soap on the day of surgery prior to arriving at the hospital.  14. Do not apply any deodorants/lotions/powders.  15. Please wear clean clothes to the hospital.

## 2022-11-18 ENCOUNTER — Encounter
Admission: RE | Admit: 2022-11-18 | Discharge: 2022-11-18 | Disposition: A | Discharge status: Home / Self Care | Payer: Medicare Other | Source: Ambulatory Visit | Attending: Obstetrics and Gynecology | Admitting: Obstetrics and Gynecology

## 2022-11-18 DIAGNOSIS — E876 Hypokalemia: Secondary | ICD-10-CM | POA: Insufficient documentation

## 2022-11-18 DIAGNOSIS — R9389 Abnormal findings on diagnostic imaging of other specified body structures: Secondary | ICD-10-CM | POA: Insufficient documentation

## 2022-11-18 DIAGNOSIS — E1142 Type 2 diabetes mellitus with diabetic polyneuropathy: Secondary | ICD-10-CM | POA: Diagnosis not present

## 2022-11-18 DIAGNOSIS — N950 Postmenopausal bleeding: Secondary | ICD-10-CM | POA: Insufficient documentation

## 2022-11-18 DIAGNOSIS — I10 Essential (primary) hypertension: Secondary | ICD-10-CM | POA: Insufficient documentation

## 2022-11-18 DIAGNOSIS — N840 Polyp of corpus uteri: Secondary | ICD-10-CM | POA: Insufficient documentation

## 2022-11-18 DIAGNOSIS — Z79899 Other long term (current) drug therapy: Secondary | ICD-10-CM

## 2022-11-18 DIAGNOSIS — Z01812 Encounter for preprocedural laboratory examination: Secondary | ICD-10-CM | POA: Diagnosis present

## 2022-11-18 LAB — TYPE AND SCREEN
ABO/RH(D): O POS
Antibody Screen: NEGATIVE

## 2022-11-18 LAB — BASIC METABOLIC PANEL
Anion gap: 6 (ref 5–15)
BUN: 20 mg/dL (ref 8–23)
CO2: 28 mmol/L (ref 22–32)
Calcium: 8.6 mg/dL — ABNORMAL LOW (ref 8.9–10.3)
Chloride: 98 mmol/L (ref 98–111)
Creatinine, Ser: 1.1 mg/dL — ABNORMAL HIGH (ref 0.44–1.00)
GFR, Estimated: 55 mL/min — ABNORMAL LOW (ref >60)
Glucose, Bld: 194 mg/dL — ABNORMAL HIGH (ref 70–99)
Potassium: 3 mmol/L — ABNORMAL LOW (ref 3.5–5.1)
Sodium: 132 mmol/L — ABNORMAL LOW (ref 135–145)

## 2022-11-18 LAB — CBC
HCT: 39.7 % (ref 36.0–46.0)
Hemoglobin: 13 g/dL (ref 12.0–15.0)
MCH: 28.8 pg (ref 26.0–34.0)
MCHC: 32.7 g/dL (ref 30.0–36.0)
MCV: 87.8 fL (ref 80.0–100.0)
Platelets: 220 10*3/uL (ref 150–400)
RBC: 4.52 MIL/uL (ref 3.87–5.11)
RDW: 15.5 % (ref 11.5–15.5)
WBC: 5.3 10*3/uL (ref 4.0–10.5)
nRBC: 0 % (ref 0.0–0.2)

## 2022-11-18 MED ORDER — POTASSIUM CHLORIDE CRYS ER 20 MEQ PO TBCR: 20.0000 meq | EXTENDED_RELEASE_TABLET | Freq: Every day | ORAL | 0 refills | Status: AC

## 2022-11-18 NOTE — Progress Notes (Signed)
[facility] Perioperative Services: Pre-Admission/Anesthesia Testing  Abnormal Lab Notification and Treatment Plan of Care   Date: 11/18/22  Name: Dana Willis MRN:   [id number]  Re: Abnormal labs noted during PAT appointment   Notified:  Provider Name Provider Role Notification Mode  Thomasene Mohair, MD OB/GYN (Surgeon) Routed and/or faxed via Carmie Kanner, MD Primary Care Provider Routed and/or faxed via [facility]   Clinical Information and Notes:  ABNORMAL LAB VALUE(S): Lab Results  Component Value Date   K 3.0 (L) 11/18/2022   Dana Willis is scheduled for an elective XI ROBOTIC ASSISTED TOTAL HYSTERECTOMY WITH BILATERAL SALPINGO OOPHORECTOMY, POSSIBLE SENTINEL LYMPH NODE INJECTION/ MAPPING/ REMOVAL, POSSIBLE STAGING (BILATERAL) on 11/24/2022. In review of her medication reconciliation, it is noted that the patient is taking prescribed diuretic medications (HCTZ 25 mg) daily.   Please note, in efforts to promote a safe and effective anesthetic course, per current guidelines/standards set by the [facility] anesthesia team, the minimal acceptable K+ level for the patient to proceed with general anesthesia is 3.0 mmol/L. With that being said, if the patient drops any lower, her elective procedure will need to be postponed until K+ is better optimized. In efforts to prevent case cancellation, will make efforts to optimize pre-surgical K+ level so that patient can safely undergo the planned surgical intervention.   Impression and Plan:  Dana Willis found to be HYPOkalemic at 3.0 mmol/L on preoperative labs.   She is on thiazide diuretic therapy. Patient does not take any type of K+ supplements. Contacted patient to discuss, however there was no answer; LDMOM and sent patient a message in MyChart. Discussed diuretic therapy as likely etiology in the absence of GI related symptoms (no diarrhea). Reviewed other potential causes, including decreased intake of dietary  K+ and potential for insensible losses. Reviewed plans for preoperative optimization as follows:   Meds ordered this encounter  Medications   potassium chloride SA (KLOR-CON M) 20 MEQ tablet    Sig: Take 1 tablet (20 mEq total) by mouth daily. Be sure to take dose on day of procedure. Follow up with PCP for repeat labs.    Dispense:  7 tablet    Refill:  0    Please contact patient once Rx is filled and ready. This is for preoperative optimization and needs to be started ASAP.   Encouraged patient to follow up with PCP about 2-3 weeks postoperatively to have labs rechecked to ensure that levels are remaining within normal range. Discussed nutritional intake of K+ rich foods as an adjunctive way to keep her K+ levels normal; list of K+ rich foods provided. Also mentioned ORS, however advised her not to rely solely on these drinks, as they are high in Na+, and she has a HTN diagnosis.   Will send copy of this note to surgeon and PCP to make them aware of K+ level and plans for correction. Discussed that PCP may elect to pursue a change in diuretic therapy to a K+ sparing type medication, or alternatively, they may consider adding a daily K+ supplement if levels remain low on recheck. Order entered to recheck K+ on the day of her surgery to ensure optimization. Wished patient the best of luck with her upcoming surgery and subsequent recovery. She was encouraged to return call to the PAT clinic, or to her surgeon's office, should any questions or concerns arise between now and the time of her surgery.    Encounter diagnoses:  Pre-operative laboratory examination Diuretic-induced  hypokalemia Long term current use of diuretic  Quentin Mulling, MSN, APRN, FNP-C, CEN [facility]  Perioperative Services Nurse Practitioner Phone: [phone] 11/18/22 2:45 PM  NOTE: This note has been prepared using Dragon dictation software. Despite my best ability to proofread, there is always the  potential that unintentional transcriptional errors may still occur from this process.

## 2022-11-22 ENCOUNTER — Ambulatory Visit (INDEPENDENT_AMBULATORY_CARE_PROVIDER_SITE_OTHER): Payer: Medicare Other | Admitting: Internal Medicine

## 2022-11-22 VITALS — BP 133/68 | HR 63 | Resp 12 | Ht 61.0 in | Wt 180.1 lb

## 2022-11-22 DIAGNOSIS — Z7189 Other specified counseling: Secondary | ICD-10-CM | POA: Diagnosis not present

## 2022-11-22 DIAGNOSIS — I10 Essential (primary) hypertension: Secondary | ICD-10-CM | POA: Diagnosis not present

## 2022-11-22 DIAGNOSIS — G4733 Obstructive sleep apnea (adult) (pediatric): Secondary | ICD-10-CM

## 2022-11-22 NOTE — Progress Notes (Unsigned)
[facility] [address] [place], [place] [address]  Pulmonary Sleep Medicine   Office Visit Note  Patient Name: Evalet Luttrull DOB: 1955-07-24 MRN [id number]    Chief Complaint: Obstructive Sleep Apnea visit  Brief History:  Liliana is seen today for an annual follow up visit for APAP@ 6-15 cmH2O. The patient has a 12+ year history of sleep apnea. Patient is using PAP nightly.  The patient feels rested after sleeping with PAP.  The patient reports benefit from PAP use. Reported sleepiness is  improved and the Epworth Sleepiness Score is 2 out of 24. The patient does not take naps. The patient complains of the following: oral venting some nights gives her a dry mouth. Wants to avoid a full face mask and consider mouth tape.  The compliance download shows 99%  compliance with an average use time of 7.5 hours. The AHI is 0.5  The patient does not complain of limb movements disrupting sleep. The patient continues to require PAP therapy in order to eliminate sleep apnea. Wishes to discuss Inspire.  ROS  General: (-) fever, (-) chills, (-) night sweat Nose and Sinuses: (-) nasal stuffiness or itchiness, (-) postnasal drip, (-) nosebleeds, (-) sinus trouble. Mouth and Throat: (-) sore throat, (-) hoarseness. Neck: (-) swollen glands, (-) enlarged thyroid, (-) neck pain. Respiratory: - cough, - shortness of breath, - wheezing. Neurologic: - numbness, - tingling. Psychiatric: - anxiety, - depression   Current Medication: Outpatient Encounter Medications as of 11/22/2022  Medication Sig   acyclovir (ZOVIRAX) 400 MG tablet Take 400 mg by mouth 2 (two) times daily.   ALPRAZolam (XANAX) 0.25 MG tablet Take 0.25 mg by mouth as needed for anxiety.   Ascorbic Acid (VITAMIN C) 1000 MG tablet Take 1,000 mg by mouth daily.   aspirin 81 MG tablet Take 81 mg by mouth daily.   Calcium Carbonate (CALCIUM 600 PO) Take by mouth 2 (two) times daily.   Cholecalciferol 25 MCG (1000 UT) tablet  Take by mouth.   dapagliflozin propanediol (FARXIGA) 10 MG TABS tablet Take 1 tablet by mouth every morning.   doxazosin (CARDURA) 4 MG tablet Take 4 mg by mouth every morning.   ELDERBERRY PO Take 550 mg by mouth 2 (two) times daily.   hydrochlorothiazide (HYDRODIURIL) 25 MG tablet Take 25 mg by mouth daily.   HYDROcodone-acetaminophen (NORCO/VICODIN) 5-325 MG tablet Take 1 tablet by mouth every 8 (eight) hours as needed (breakthrough pain). (Patient not taking: Reported on 10/06/2022)   ibuprofen (ADVIL) 600 MG tablet Take 1 tablet (600 mg total) by mouth every 6 (six) hours as needed for mild pain, cramping or moderate pain.   lisinopril (ZESTRIL) 20 MG tablet Take 20 mg by mouth daily.   metFORMIN (GLUCOPHAGE) 1000 MG tablet Take 1 tablet by mouth daily.   metoprolol (LOPRESSOR) 50 MG tablet Take 50 mg by mouth daily.   Multiple Vitamins-Minerals (MULTIVITAMIN ADULT PO) Take by mouth daily.   omega-3 acid ethyl esters (LOVAZA) 1 g capsule Take by mouth 2 (two) times daily.   pantoprazole (PROTONIX) 40 MG tablet Take 40 mg by mouth daily.   PARoxetine (PAXIL) 10 MG tablet Take 10 mg by mouth at bedtime.   potassium chloride SA (KLOR-CON M) 20 MEQ tablet Take 1 tablet (20 mEq total) by mouth daily. Be sure to take dose on day of procedure. Follow up with PCP for repeat labs.   pregabalin (LYRICA) 200 MG capsule Take 200 mg by mouth 2 (two) times daily.  Probiotic, Lactobacillus, CAPS Take by mouth.   rosuvastatin (CRESTOR) 20 MG tablet Take 20 mg by mouth daily.   Semaglutide, 1 MG/DOSE, (OZEMPIC, 1 MG/DOSE,) 4 MG/3ML SOPN Inject 0.75 mLs into the skin once a week. Saturday   TRESIBA FLEXTOUCH 200 UNIT/ML FlexTouch Pen Inject 90 Units into the skin at bedtime.   No facility-administered encounter medications on file as of 11/22/2022.    Surgical History: Past Surgical History:  Procedure Laterality Date   BRAIN SURGERY  02/01/2006   Shunt   BREAST FIBROADENOMA SURGERY     CATARACT  EXTRACTION W/PHACO Right 05/19/2015   Procedure: CATARACT EXTRACTION PHACO AND INTRAOCULAR LENS PLACEMENT (IOC) right eye;  Surgeon: Sherald Hess, MD;  Location: [facility];  Service: Ophthalmology;  Laterality: Right;  DIABETIC - oral meds CPAP LEAVE PT 1ST   CATARACT EXTRACTION W/PHACO Left 06/23/2015   Procedure: CATARACT EXTRACTION PHACO AND INTRAOCULAR LENS PLACEMENT (IOC) left eye;  Surgeon: Sherald Hess, MD;  Location: [facility];  Service: Ophthalmology;  Laterality: Left;  DIABETIC - oral meds   CESAREAN SECTION  1991   CHOLECYSTECTOMY     COLONOSCOPY WITH PROPOFOL N/A 05/21/2022   Procedure: COLONOSCOPY WITH PROPOFOL;  Surgeon: Regis Bill, MD;  Location: [facility];  Service: Endoscopy;  Laterality: N/A;   EYE SURGERY     HERNIA REPAIR     HYSTEROSCOPY WITH D & C N/A 08/26/2022   Procedure: ATTEMPTED HYSTEROSCOPY, DILATATION AND CURETTAGE;  Surgeon: Conard Novak, MD;  Location: [facility];  Service: Gynecology;  Laterality: N/A;  Dilation and Attemtped Hysteroscopy performed; Polypectomy and Curretage aborted by surgeon   TUBAL LIGATION      Medical History: Past Medical History:  Diagnosis Date   Angina at rest [facility])    Anxiety    panic attacks   B12 deficiency anemia    Depression    Diabetes mellitus without complication (HCC)    Type 2   GERD (gastroesophageal reflux disease)    HSV-2 infection    Hydrocephalus (HCC)    normal pressure   Hyperlipidemia    Hypertension    Infertility management    Insulin dependent type 2 diabetes mellitus (HCC)    Lumbar disc disorder    compression   Motion sickness    any moving vehicle   Neuropathy of both feet    Pt attributes to DM   NPH (normal pressure hydrocephalus) (HCC)    PONV (postoperative nausea and vomiting)    Right rotator cuff tear    Sleep apnea    CPAP   SOB (shortness of breath)    Tachycardia, unspecified    Vertigo    daily   Vitamin D  deficiency disease     Family History: Non contributory to the present illness  Social History: Social History   Socioeconomic History   Marital status: Married    Spouse name: Deniece Portela   Number of children: 1   Years of education: Not on file   Highest education level: Not on file  Occupational History   Not on file  Tobacco Use   Smoking status: Former    Current packs/day: 0.00    Types: Cigarettes    Quit date: 02/02/1983    Years since quitting: 39.8   Smokeless tobacco: Never  Vaping Use   Vaping status: Never Used  Substance and Sexual Activity   Alcohol use: Yes    Alcohol/week: 1.0 standard drink of alcohol    Types: 1  Glasses of wine per week   Drug use: Never   Sexual activity: Not on file  Other Topics Concern   Not on file  Social History Narrative   Not on file   Social Determinants of Health   Financial Resource Strain: Not on file  Food Insecurity: Not on file  Transportation Needs: Not on file  Physical Activity: Not on file  Stress: Not on file  Social Connections: Not on file  Intimate Partner Violence: Not on file    Vital Signs: There were no vitals taken for this visit. There is no height or weight on file to calculate BMI.    Examination: General Appearance: The patient is well-developed, well-nourished, and in no distress. Neck Circumference: 39.5cm Skin: Gross inspection of skin unremarkable. Head: normocephalic, no gross deformities. Eyes: no gross deformities noted. ENT: ears appear grossly normal Neurologic: Alert and oriented. No involuntary movements.  STOP BANG RISK ASSESSMENT S (snore) Have you been told that you snore?     No   T (tired) Are you often tired, fatigued, or sleepy during the day?   NO  O (obstruction) Do you stop breathing, choke, or gasp during sleep? NO   P (pressure) Do you have or are you being treated for high blood pressure? YES   B (BMI) Is your body index greater than 35 kg/m? YES   A (age) Are  you 49 years old or older? YES   N (neck) Do you have a neck circumference greater than 16 inches?   NO   G (gender) Are you a female? NO   TOTAL STOP/BANG "YES" ANSWERS 3       A STOP-Bang score of 2 or less is considered low risk, and a score of 5 or more is high risk for having either moderate or severe OSA. For people who score 3 or 4, doctors may need to perform further assessment to determine how likely they are to have OSA.         EPWORTH SLEEPINESS SCALE:  Scale:  (0)= no chance of dozing; (1)= slight chance of dozing; (2)= moderate chance of dozing; (3)= high chance of dozing  Chance  Situtation    Sitting and reading: 1    Watching TV: 1    Sitting Inactive in public: 0    As a passenger in car: 0      Lying down to rest: 0    Sitting and talking: 0    Sitting quielty after lunch: 0    In a car, stopped in traffic: 0   TOTAL SCORE:   2 out of 24    SLEEP STUDIES:  PSG (08/14/20) AHI 80.9, min SPO2 73%   CPAP COMPLIANCE DATA:  Date Range: 11/22/2021-11/21/2022  Average Daily Use: 7.5 hours  Median Use: 7 hrs 45 min  Compliance for > 4 Hours: 99% days  AHI: 0.5 respiratory events per hour  Days Used: 363/365  Mask Leak: 28.4  95th Percentile Pressure: 14.8         LABS: Recent Results (from the past 2160 hour(s))  Glucose, capillary     Status: Abnormal   Collection Time: 08/26/22  8:41 AM  Result Value Ref Range   Glucose-Capillary 118 (H) 70 - 99 mg/dL    Comment: Glucose reference range applies only to samples taken after fasting for at least 8 hours.  Glucose, capillary     Status: Abnormal   Collection Time: 08/26/22 11:39 AM  Result Value Ref Range  Glucose-Capillary 117 (H) 70 - 99 mg/dL    Comment: Glucose reference range applies only to samples taken after fasting for at least 8 hours.  CBC     Status: None   Collection Time: 11/18/22  1:30 PM  Result Value Ref Range   WBC 5.3 4.0 - 10.5 K/uL   RBC 4.52 3.87 - 5.11  MIL/uL   Hemoglobin 13.0 12.0 - 15.0 g/dL   HCT 16.1 09.6 - 04.5 %   MCV 87.8 80.0 - 100.0 fL   MCH 28.8 26.0 - 34.0 pg   MCHC 32.7 30.0 - 36.0 g/dL   RDW 40.9 81.1 - 91.4 %   Platelets 220 150 - 400 K/uL   nRBC 0.0 0.0 - 0.2 %    Comment: Performed at [facility], [address] [place], [place] [address]  Type and screen [facility]     Status: None   Collection Time: 11/18/22  1:30 PM  Result Value Ref Range   ABO/RH(D) O POS    Antibody Screen NEG    Sample Expiration 12/02/2022,2359    Extend sample reason      NO TRANSFUSIONS OR PREGNANCY IN THE PAST 3 MONTHS Performed at [facility], [address] [place], [place] [address]   Basic metabolic panel per protocol     Status: Abnormal   Collection Time: 11/18/22  1:30 PM  Result Value Ref Range   Sodium 132 (L) 135 - 145 mmol/L   Potassium 3.0 (L) 3.5 - 5.1 mmol/L   Chloride 98 98 - 111 mmol/L   CO2 28 22 - 32 mmol/L   Glucose, Bld 194 (H) 70 - 99 mg/dL    Comment: Glucose reference range applies only to samples taken after fasting for at least 8 hours.   BUN 20 8 - 23 mg/dL   Creatinine, Ser 8.65 (H) 0.44 - 1.00 mg/dL   Calcium 8.6 (L) 8.9 - 10.3 mg/dL   GFR, Estimated 55 (L) >60 mL/min    Comment: (NOTE) Calculated using the CKD-EPI Creatinine Equation (2021)    Anion gap 6 5 - 15    Comment: Performed at [facility], [address] [place], [place] [address]    Radiology: No results found.  No results found.  No results found.    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 OSA on CPAP The patient does tolerate PAP and reports  benefit from PAP use. The patient was reminded how to clean equipment and advised to replace supplies routinely. She had questions on Inspire and we reviewed that at length. After discussion she is not interested in pursuing that.. The patient was also counselled on weight loss. The compliance is excellent. The AHI is 0.5.   OSA on cpap- controlled. Continue with excellent compliance with pap. CPAP continues to be medically necessary to treat this patient's OSA. F/u  one year.    2. CPAP use counseling CPAP Counseling: had a lengthy discussion with the patient regarding the importance of PAP therapy in management of the sleep apnea. Patient appears to understand the risk factor reduction and also understands the risks associated with untreated sleep apnea. Patient will try to make a good faith effort to remain compliant with therapy. Also instructed the patient on proper cleaning of the device including the water must be changed daily if possible and use of distilled water is preferred. Patient understands that the machine should be regularly cleaned with appropriate recommended cleaning solutions that do not damage the PAP machine for example given white vinegar and water rinses. Other methods such as ozone treatment may not be as good as these simple methods to achieve cleaning.   3. Essential hypertension Hypertension Counseling:   The following hypertensive lifestyle modification were recommended and discussed:  1. Limiting alcohol intake to less than 1 oz/day of ethanol:(24 oz of beer or 8 oz of wine or 2 oz of 100-proof whiskey). 2. Take baby ASA 81 mg  daily. 3. Importance of regular aerobic exercise and losing weight. 4. Reduce dietary saturated fat and cholesterol intake for overall cardiovascular health. 5. Maintaining adequate dietary potassium, calcium, and magnesium intake. 6. Regular monitoring of the blood pressure. 7. Reduce sodium intake to less than 100 mmol/day (less than 2.3 gm of sodium or less than 6 gm of sodium choride)       General Counseling: I have discussed the findings of the evaluation and examination with Elnita Maxwell.  I have also discussed any further diagnostic evaluation thatmay be needed or ordered today. Jaidalyn verbalizes understanding of the findings of todays visit. We also reviewed her medications today and discussed drug interactions and side effects including but not limited excessive drowsiness and altered mental states. We also discussed that there is always a risk not just to her but also people around her. she has been encouraged to call the office with any questions or concerns that should arise related to todays visit.  No orders of the defined types were placed in this encounter.       I have personally obtained a history, examined the patient, evaluated laboratory and imaging results, formulated the assessment and plan and placed orders. This patient was seen today by Emmaline Kluver, PA-C in collaboration with Dr. Freda Munro.   Yevonne Pax, MD [facility] Diplomate ABMS Pulmonary Critical Care Medicine and Sleep Medicine

## 2022-11-22 NOTE — Patient Instructions (Signed)

## 2022-11-23 MED ORDER — CEFAZOLIN SODIUM-DEXTROSE 2-4 GM/100ML-% IV SOLN
2.0000 g | INTRAVENOUS | Status: AC
  Administered 2022-11-24: 2 g via INTRAVENOUS

## 2022-11-23 MED ORDER — ORAL CARE MOUTH RINSE: 15.0000 mL | Freq: Once | OROMUCOSAL | Status: AC

## 2022-11-23 MED ORDER — POVIDONE-IODINE 10 % EX SWAB
2.0000 | Freq: Once | CUTANEOUS | Status: AC
  Administered 2022-11-24: 2 via TOPICAL

## 2022-11-23 MED ORDER — CHLORHEXIDINE GLUCONATE 0.12 % MT SOLN
15.0000 mL | Freq: Once | OROMUCOSAL | Status: AC
  Administered 2022-11-24: 15 mL via OROMUCOSAL

## 2022-11-23 MED ORDER — SOD CITRATE-CITRIC ACID 500-334 MG/5ML PO SOLN
30.0000 mL | ORAL | Status: DC
  Filled 2022-11-23: qty 30

## 2022-11-23 MED ORDER — SODIUM CHLORIDE 0.9 % IV SOLN: INTRAVENOUS | Status: DC

## 2022-11-24 ENCOUNTER — Ambulatory Visit: Payer: Self-pay | Admitting: Anesthesiology

## 2022-11-24 ENCOUNTER — Ambulatory Visit
Admission: RE | Admit: 2022-11-24 | Discharge: 2022-11-24 | Disposition: A | Discharge status: Home / Self Care | Payer: Medicare Other | Source: Ambulatory Visit | Attending: Obstetrics and Gynecology | Admitting: Obstetrics and Gynecology

## 2022-11-24 ENCOUNTER — Other Ambulatory Visit: Payer: Self-pay

## 2022-11-24 ENCOUNTER — Encounter
Admission: RE | Disposition: A | Discharge status: Home / Self Care | Payer: Self-pay | Source: Ambulatory Visit | Attending: Obstetrics and Gynecology

## 2022-11-24 ENCOUNTER — Ambulatory Visit: Payer: Medicare Other | Admitting: Urgent Care

## 2022-11-24 ENCOUNTER — Encounter: Payer: Self-pay | Admitting: Obstetrics and Gynecology

## 2022-11-24 DIAGNOSIS — D271 Benign neoplasm of left ovary: Secondary | ICD-10-CM | POA: Insufficient documentation

## 2022-11-24 DIAGNOSIS — I10 Essential (primary) hypertension: Secondary | ICD-10-CM | POA: Diagnosis not present

## 2022-11-24 DIAGNOSIS — G4730 Sleep apnea, unspecified: Secondary | ICD-10-CM | POA: Insufficient documentation

## 2022-11-24 DIAGNOSIS — Z794 Long term (current) use of insulin: Secondary | ICD-10-CM | POA: Diagnosis not present

## 2022-11-24 DIAGNOSIS — F32A Depression, unspecified: Secondary | ICD-10-CM | POA: Diagnosis not present

## 2022-11-24 DIAGNOSIS — Z01812 Encounter for preprocedural laboratory examination: Secondary | ICD-10-CM

## 2022-11-24 DIAGNOSIS — G709 Myoneural disorder, unspecified: Secondary | ICD-10-CM | POA: Insufficient documentation

## 2022-11-24 DIAGNOSIS — N950 Postmenopausal bleeding: Secondary | ICD-10-CM | POA: Insufficient documentation

## 2022-11-24 DIAGNOSIS — E119 Type 2 diabetes mellitus without complications: Secondary | ICD-10-CM | POA: Diagnosis not present

## 2022-11-24 DIAGNOSIS — N72 Inflammatory disease of cervix uteri: Secondary | ICD-10-CM | POA: Diagnosis not present

## 2022-11-24 DIAGNOSIS — N840 Polyp of corpus uteri: Secondary | ICD-10-CM | POA: Insufficient documentation

## 2022-11-24 DIAGNOSIS — F419 Anxiety disorder, unspecified: Secondary | ICD-10-CM | POA: Insufficient documentation

## 2022-11-24 DIAGNOSIS — K219 Gastro-esophageal reflux disease without esophagitis: Secondary | ICD-10-CM | POA: Insufficient documentation

## 2022-11-24 DIAGNOSIS — N9489 Other specified conditions associated with female genital organs and menstrual cycle: Secondary | ICD-10-CM | POA: Insufficient documentation

## 2022-11-24 DIAGNOSIS — R9389 Abnormal findings on diagnostic imaging of other specified body structures: Secondary | ICD-10-CM | POA: Diagnosis present

## 2022-11-24 DIAGNOSIS — N8003 Adenomyosis of the uterus: Secondary | ICD-10-CM | POA: Insufficient documentation

## 2022-11-24 DIAGNOSIS — D270 Benign neoplasm of right ovary: Secondary | ICD-10-CM | POA: Diagnosis not present

## 2022-11-24 DIAGNOSIS — Z7984 Long term (current) use of oral hypoglycemic drugs: Secondary | ICD-10-CM | POA: Insufficient documentation

## 2022-11-24 DIAGNOSIS — Z87891 Personal history of nicotine dependence: Secondary | ICD-10-CM | POA: Insufficient documentation

## 2022-11-24 DIAGNOSIS — N736 Female pelvic peritoneal adhesions (postinfective): Secondary | ICD-10-CM

## 2022-11-24 DIAGNOSIS — E1142 Type 2 diabetes mellitus with diabetic polyneuropathy: Secondary | ICD-10-CM

## 2022-11-24 DIAGNOSIS — N882 Stricture and stenosis of cervix uteri: Secondary | ICD-10-CM | POA: Diagnosis not present

## 2022-11-24 DIAGNOSIS — E876 Hypokalemia: Secondary | ICD-10-CM

## 2022-11-24 HISTORY — PX: ROBOTIC ASSISTED TOTAL HYSTERECTOMY WITH BILATERAL SALPINGO OOPHERECTOMY: SHX6086

## 2022-11-24 LAB — POCT I-STAT, CHEM 8
BUN: 15 mg/dL (ref 8–23)
Calcium, Ion: 1.24 mmol/L (ref 1.15–1.40)
Chloride: 106 mmol/L (ref 98–111)
Creatinine, Ser: 0.9 mg/dL (ref 0.44–1.00)
Glucose, Bld: 140 mg/dL — ABNORMAL HIGH (ref 70–99)
HCT: 40 % (ref 36.0–46.0)
Hemoglobin: 13.6 g/dL (ref 12.0–15.0)
Potassium: 3.8 mmol/L (ref 3.5–5.1)
Sodium: 144 mmol/L (ref 135–145)
TCO2: 24 mmol/L (ref 22–32)

## 2022-11-24 LAB — ABO/RH: ABO/RH(D): O POS

## 2022-11-24 LAB — GLUCOSE, CAPILLARY: Glucose-Capillary: 146 mg/dL — ABNORMAL HIGH (ref 70–99)

## 2022-11-24 SURGERY — HYSTERECTOMY, TOTAL, ROBOT-ASSISTED, LAPAROSCOPIC, WITH BILATERAL SALPINGO-OOPHORECTOMY
Anesthesia: General | Site: Pelvis | Laterality: Bilateral

## 2022-11-24 MED ORDER — SEVOFLURANE IN SOLN
RESPIRATORY_TRACT | Status: AC
  Filled 2022-11-24: qty 250

## 2022-11-24 MED ORDER — HYDROMORPHONE HCL 1 MG/ML IJ SOLN
INTRAMUSCULAR | Status: DC | PRN
Start: 2022-11-24 — End: 2022-11-24
  Administered 2022-11-24 (×2): .5 mg via INTRAVENOUS

## 2022-11-24 MED ORDER — CEFAZOLIN SODIUM-DEXTROSE 2-4 GM/100ML-% IV SOLN
INTRAVENOUS | Status: AC
  Filled 2022-11-24: qty 100

## 2022-11-24 MED ORDER — METRONIDAZOLE 500 MG/100ML IV SOLN
500.0000 mg | INTRAVENOUS | Status: AC
  Administered 2022-11-24: 500 mg via INTRAVENOUS
  Filled 2022-11-24: qty 100

## 2022-11-24 MED ORDER — SUGAMMADEX SODIUM 200 MG/2ML IV SOLN
INTRAVENOUS | Status: DC | PRN
  Administered 2022-11-24: 200 mg via INTRAVENOUS

## 2022-11-24 MED ORDER — MIDAZOLAM HCL 2 MG/2ML IJ SOLN
INTRAMUSCULAR | Status: DC | PRN
  Administered 2022-11-24: 2 mg via INTRAVENOUS

## 2022-11-24 MED ORDER — GLYCOPYRROLATE 0.2 MG/ML IJ SOLN
INTRAMUSCULAR | Status: DC | PRN
  Administered 2022-11-24: .2 mg via INTRAVENOUS

## 2022-11-24 MED ORDER — FENTANYL CITRATE (PF) 100 MCG/2ML IJ SOLN
INTRAMUSCULAR | Status: DC | PRN
  Administered 2022-11-24 (×2): 50 ug via INTRAVENOUS

## 2022-11-24 MED ORDER — CHLORHEXIDINE GLUCONATE 0.12 % MT SOLN
OROMUCOSAL | Status: AC
  Filled 2022-11-24: qty 15

## 2022-11-24 MED ORDER — HYDROMORPHONE HCL 1 MG/ML IJ SOLN
INTRAMUSCULAR | Status: AC
  Filled 2022-11-24: qty 1

## 2022-11-24 MED ORDER — PROPOFOL 10 MG/ML IV BOLUS
INTRAVENOUS | Status: AC
  Filled 2022-11-24: qty 20

## 2022-11-24 MED ORDER — BUPIVACAINE HCL (PF) 0.5 % IJ SOLN
INTRAMUSCULAR | Status: AC
  Filled 2022-11-24: qty 30

## 2022-11-24 MED ORDER — FENTANYL CITRATE (PF) 100 MCG/2ML IJ SOLN: 25.0000 ug | INTRAMUSCULAR | Status: DC | PRN

## 2022-11-24 MED ORDER — IBUPROFEN 600 MG PO TABS: 600.0000 mg | ORAL_TABLET | Freq: Four times a day (QID) | ORAL | 0 refills | Status: AC

## 2022-11-24 MED ORDER — ROCURONIUM BROMIDE 100 MG/10ML IV SOLN
INTRAVENOUS | Status: DC | PRN
  Administered 2022-11-24: 20 mg via INTRAVENOUS
  Administered 2022-11-24: 30 mg via INTRAVENOUS
  Administered 2022-11-24: 50 mg via INTRAVENOUS

## 2022-11-24 MED ORDER — LIDOCAINE HCL (PF) 2 % IJ SOLN
INTRAMUSCULAR | Status: DC | PRN
  Administered 2022-11-24: 60 mg via INTRADERMAL

## 2022-11-24 MED ORDER — BUPIVACAINE HCL (PF) 0.5 % IJ SOLN
INTRAMUSCULAR | Status: DC | PRN
  Administered 2022-11-24: 10 mL

## 2022-11-24 MED ORDER — PROPOFOL 10 MG/ML IV BOLUS
INTRAVENOUS | Status: DC | PRN
  Administered 2022-11-24: 150 mg via INTRAVENOUS

## 2022-11-24 MED ORDER — 0.9 % SODIUM CHLORIDE (POUR BTL) OPTIME
TOPICAL | Status: DC | PRN
  Administered 2022-11-24: 500 mL

## 2022-11-24 MED ORDER — ONDANSETRON 4 MG PO TBDP: 4.0000 mg | ORAL_TABLET | Freq: Four times a day (QID) | ORAL | 0 refills | Status: AC | PRN

## 2022-11-24 MED ORDER — ONDANSETRON HCL 4 MG/2ML IJ SOLN
INTRAMUSCULAR | Status: AC
  Filled 2022-11-24: qty 2

## 2022-11-24 MED ORDER — DEXAMETHASONE SODIUM PHOSPHATE 10 MG/ML IJ SOLN
INTRAMUSCULAR | Status: AC
  Filled 2022-11-24: qty 1

## 2022-11-24 MED ORDER — FENTANYL CITRATE (PF) 100 MCG/2ML IJ SOLN
INTRAMUSCULAR | Status: AC
  Filled 2022-11-24: qty 2

## 2022-11-24 MED ORDER — ROCURONIUM BROMIDE 10 MG/ML (PF) SYRINGE
PREFILLED_SYRINGE | INTRAVENOUS | Status: AC
  Filled 2022-11-24: qty 10

## 2022-11-24 MED ORDER — OXYCODONE HCL 5 MG/5ML PO SOLN: 5.0000 mg | Freq: Once | ORAL | Status: DC | PRN

## 2022-11-24 MED ORDER — EPHEDRINE SULFATE (PRESSORS) 50 MG/ML IJ SOLN
INTRAMUSCULAR | Status: DC | PRN
  Administered 2022-11-24 (×2): 10 mg via INTRAVENOUS

## 2022-11-24 MED ORDER — OXYCODONE HCL 5 MG PO TABS: 5.0000 mg | ORAL_TABLET | Freq: Once | ORAL | Status: DC | PRN

## 2022-11-24 MED ORDER — ONDANSETRON HCL 4 MG/2ML IJ SOLN
INTRAMUSCULAR | Status: DC | PRN
  Administered 2022-11-24: 4 mg via INTRAVENOUS

## 2022-11-24 MED ORDER — SODIUM CHLORIDE 0.9 % IV SOLN: INTRAVENOUS | Status: AC

## 2022-11-24 MED ORDER — EPHEDRINE 5 MG/ML INJ
INTRAVENOUS | Status: AC
  Filled 2022-11-24: qty 5

## 2022-11-24 MED ORDER — LIDOCAINE HCL (PF) 2 % IJ SOLN
INTRAMUSCULAR | Status: AC
  Filled 2022-11-24: qty 5

## 2022-11-24 MED ORDER — OXYCODONE-ACETAMINOPHEN 5-325 MG PO TABS: 1.0000 | ORAL_TABLET | Freq: Four times a day (QID) | ORAL | 0 refills | Status: AC | PRN

## 2022-11-24 MED ORDER — INDOCYANINE GREEN 25 MG IV SOLR
INTRAVENOUS | Status: DC | PRN
  Administered 2022-11-24: 10 mg

## 2022-11-24 MED ORDER — DEXAMETHASONE SODIUM PHOSPHATE 10 MG/ML IJ SOLN
INTRAMUSCULAR | Status: DC | PRN
  Administered 2022-11-24: 5 mg via INTRAVENOUS

## 2022-11-24 MED ORDER — MIDAZOLAM HCL 2 MG/2ML IJ SOLN
INTRAMUSCULAR | Status: AC
  Filled 2022-11-24: qty 2

## 2022-11-24 SURGICAL SUPPLY — 100 items
ADH SKN CLS APL DERMABOND .7 (GAUZE/BANDAGES/DRESSINGS) ×1
ANCHOR TIS RET SYS 235ML (MISCELLANEOUS) IMPLANT
BAG DRN RND TRDRP ANRFLXCHMBR (UROLOGICAL SUPPLIES) ×1
BAG LAPAROSCOPIC 12 15 PORT 16 (BASKET) IMPLANT
BAG RETRIEVAL 12/15 (BASKET) IMPLANT
BAG TISS RTRVL C235 10X14 (MISCELLANEOUS)
BAG URINE DRAIN 2000ML AR STRL (UROLOGICAL SUPPLIES) ×1 IMPLANT
BLADE SURG 15 STRL LF DISP TIS (BLADE) IMPLANT
BLADE SURG 15 STRL SS (BLADE) ×1
BLADE SURG SZ11 CARB STEEL (BLADE) ×1 IMPLANT
CANNULA CAP OBTURATR AIRSEAL 8 (CAP) ×1 IMPLANT
CATH FOLEY 2WAY 5CC 16FR (CATHETERS) ×1
CATH URTH 16FR FL 2W BLN LF (CATHETERS) ×1 IMPLANT
CAUTERY HOOK MNPLR 1.6 DVNC XI (INSTRUMENTS) ×1 IMPLANT
CNTNR URN SCR LID CUP LEK RST (MISCELLANEOUS) IMPLANT
CONT SPEC 4OZ STRL OR WHT (MISCELLANEOUS) ×1
COUNTER NEEDLE 20/40 LG (NEEDLE) IMPLANT
COVER TIP SHEARS 8 DVNC (MISCELLANEOUS) ×1 IMPLANT
COVER WAND RF STERILE (DRAPES) ×1 IMPLANT
DERMABOND ADVANCED .7 DNX12 (GAUZE/BANDAGES/DRESSINGS) ×1 IMPLANT
DRAPE ARM DVNC X/XI (DISPOSABLE) ×3 IMPLANT
DRAPE COLUMN DVNC XI (DISPOSABLE) ×1 IMPLANT
DRAPE ROBOT W/ LEGGING 30X125 (DRAPES) ×1 IMPLANT
DRAPE SHEET LG 3/4 BI-LAMINATE (DRAPES) ×1 IMPLANT
DRESSING SURGICEL FIBRLLR 1X2 (HEMOSTASIS) IMPLANT
DRIVER NDL MEGA SUTCUT DVNCXI (INSTRUMENTS) ×1 IMPLANT
DRIVER NDLE MEGA SUTCUT DVNCXI (INSTRUMENTS) ×1 IMPLANT
DRSG SURGICEL FIBRILLAR 1X2 (HEMOSTASIS) IMPLANT
DRSG TELFA 3X4 N-ADH STERILE (GAUZE/BANDAGES/DRESSINGS) IMPLANT
ELECT BLADE 6 FLAT ULTRCLN (ELECTRODE) IMPLANT
ELECT CAUTERY BLADE TIP 2.5 (TIP) ×1 IMPLANT
ELECT REM PT RETURN 9FT ADLT (ELECTROSURGICAL) ×1 IMPLANT
ELECTRODE CAUTERY BLDE TIP 2.5 (TIP) ×1 IMPLANT
ELECTRODE REM PT RTRN 9FT ADLT (ELECTROSURGICAL) ×1 IMPLANT
FORCEPS BPLR 8 MD DVNC XI (FORCEP) ×1 IMPLANT
FORCEPS BPLR FENES DVNC XI (FORCEP) ×1 IMPLANT
FORCEPS BPLR R/ABLATION 8 DVNC (INSTRUMENTS) IMPLANT
GAUZE 4X4 16PLY ~~LOC~~+RFID DBL (SPONGE) ×1 IMPLANT
GLOVE BIO SURGEON STRL SZ 6.5 (GLOVE) ×6 IMPLANT
GLOVE BIOGEL PI IND STRL 6.5 (GLOVE) ×4 IMPLANT
GLOVE BIOGEL PI IND STRL 7.0 (GLOVE) ×1 IMPLANT
GOWN STRL REUS W/ TWL LRG LVL3 (GOWN DISPOSABLE) ×8 IMPLANT
GOWN STRL REUS W/TWL LRG LVL3 (GOWN DISPOSABLE) ×8
GRASPER LAPSCPC 5X45 DSP (INSTRUMENTS) IMPLANT
IRRIGATION STRYKERFLOW (MISCELLANEOUS) IMPLANT
IRRIGATOR STRYKERFLOW (MISCELLANEOUS) ×1 IMPLANT
IV NS 1000ML (IV SOLUTION)
IV NS 1000ML BAXH (IV SOLUTION) IMPLANT
KIT IMAGING PINPOINTPAQ (MISCELLANEOUS) IMPLANT
KIT PINK PAD W/HEAD ARE REST (MISCELLANEOUS) ×1 IMPLANT
KIT PINK PAD W/HEAD ARM REST (MISCELLANEOUS) ×1 IMPLANT
LABEL OR SOLS (LABEL) ×1 IMPLANT
MANIFOLD NEPTUNE II (INSTRUMENTS) ×1 IMPLANT
MANIPULATOR VCARE LG CRV RETR (MISCELLANEOUS) IMPLANT
MANIPULATOR VCARE SML CRV RETR (MISCELLANEOUS) IMPLANT
MANIPULATOR VCARE STD CRV RETR (MISCELLANEOUS) IMPLANT
NDL HYPO 22X1.5 SAFETY MO (MISCELLANEOUS) ×1 IMPLANT
NDL INSUFFLATION 14GA 120MM (NEEDLE) ×1 IMPLANT
NDL SPNL 22GX3.5 QUINCKE BK (NEEDLE) IMPLANT
NEEDLE HYPO 22X1.5 SAFETY MO (MISCELLANEOUS) ×1 IMPLANT
NEEDLE INSUFFLATION 14GA 120MM (NEEDLE) IMPLANT
NEEDLE SPNL 22GX3.5 QUINCKE BK (NEEDLE) ×1 IMPLANT
NS IRRIG 1000ML POUR BTL (IV SOLUTION) ×1 IMPLANT
NS IRRIG 500ML POUR BTL (IV SOLUTION) IMPLANT
OBTURATOR OPTICAL STND 8 DVNC (TROCAR) ×1 IMPLANT
OBTURATOR OPTICALSTD 8 DVNC (TROCAR) IMPLANT
OCCLUDER COLPOPNEUMO (BALLOONS) ×1 IMPLANT
PACK GYN LAPAROSCOPIC (MISCELLANEOUS) ×1 IMPLANT
PAD PREP OB/GYN DISP 24X41 (PERSONAL CARE ITEMS) ×1 IMPLANT
PENCIL SMOKE EVACUATOR (MISCELLANEOUS) ×1 IMPLANT
SCISSORS MNPLR CVD DVNC XI (INSTRUMENTS) ×1 IMPLANT
SCRUB CHG 4% DYNA-HEX 4OZ (MISCELLANEOUS) ×1 IMPLANT
SEAL UNIV 5-12 XI (MISCELLANEOUS) ×3 IMPLANT
SEALER VESSEL EXT DVNC XI (MISCELLANEOUS) IMPLANT
SET CYSTO W/LG BORE CLAMP LF (SET/KITS/TRAYS/PACK) IMPLANT
SET TUBE FILTERED XL AIRSEAL (SET/KITS/TRAYS/PACK) ×1 IMPLANT
SLEEVE Z-THREAD 5X100MM (TROCAR) IMPLANT
SOL ELECTROSURG ANTI STICK (MISCELLANEOUS) ×1 IMPLANT
SOLUTION ELECTROSURG ANTI STCK (MISCELLANEOUS) ×1 IMPLANT
SPONGE T-LAP 18X18 ~~LOC~~+RFID (SPONGE) IMPLANT
SUT DVC VLOC 180 0 12IN GS21 (SUTURE) ×1 IMPLANT
SUT MNCRL 4-0 (SUTURE) ×3
SUT MNCRL 4-0 27 PS-2 XMFL (SUTURE) ×3 IMPLANT
SUT MNCRL 4-0 27XMFL (SUTURE) ×3
SUT VIC AB 0 CT1 36 (SUTURE) ×1 IMPLANT
SUT VICRYL 0 UR6 27IN ABS (SUTURE) ×1 IMPLANT
SUTURE DVC VLC 180 0 12IN GS21 (SUTURE) IMPLANT
SUTURE MNCRL 4-0 27XMF (SUTURE) ×1 IMPLANT
SYR 10ML LL (SYRINGE) ×1 IMPLANT
SYR 3ML LL SCALE MARK (SYRINGE) IMPLANT
SYR 50ML LL SCALE MARK (SYRINGE) ×1 IMPLANT
SYR TOOMEY 50ML (SYRINGE) IMPLANT
SYR TOOMEY IRRIG 70ML (MISCELLANEOUS) ×1 IMPLANT
SYRINGE TOOMEY IRRIG 70ML (MISCELLANEOUS) IMPLANT
TAPE TRANSPORE STRL 2 31045 (GAUZE/BANDAGES/DRESSINGS) ×1 IMPLANT
TRAP FLUID SMOKE EVACUATOR (MISCELLANEOUS) ×1 IMPLANT
TRAP SPECIMEN MUCUS 40CC (MISCELLANEOUS) IMPLANT
TROCAR XCEL NON-BLD 11X100MML (ENDOMECHANICALS) IMPLANT
TROCAR Z-THREAD FIOS 5X100MM (TROCAR) IMPLANT
WATER STERILE IRR 500ML POUR (IV SOLUTION) ×1 IMPLANT

## 2022-11-24 NOTE — Anesthesia Procedure Notes (Signed)
Procedure Name: Intubation Date/Time: 11/24/2022 7:37 AM  Performed by: Monico Hoar, CRNAPre-anesthesia Checklist: Patient identified, Patient being monitored, Timeout performed, Emergency Drugs available and Suction available Patient Re-evaluated:Patient Re-evaluated prior to induction Oxygen Delivery Method: Circle system utilized Preoxygenation: Pre-oxygenation with 100% oxygen Induction Type: IV induction Ventilation: Mask ventilation without difficulty Laryngoscope Size: Mac and 3 Grade View: Grade I Tube type: Oral Tube size: 6.5 mm Number of attempts: 1 Airway Equipment and Method: Stylet Placement Confirmation: ETT inserted through vocal cords under direct vision, positive ETCO2 and breath sounds checked- equal and bilateral Secured at: 21 cm Tube secured with: Tape Dental Injury: Teeth and Oropharynx as per pre-operative assessment

## 2022-11-24 NOTE — Anesthesia Postprocedure Evaluation (Signed)
Anesthesia Post Note  Patient: Dana Willis  Procedure(s) Performed: XI ROBOTIC ASSISTED TOTAL HYSTERECTOMY WITH BILATERAL SALPINGO OOPHORECTOMY,  Sentinel Lymph Node Injection/ Mapping (Bilateral: Pelvis)  Patient location during evaluation: PACU Anesthesia Type: General Level of consciousness: awake and alert Pain management: pain level controlled Vital Signs Assessment: post-procedure vital signs reviewed and stable Respiratory status: spontaneous breathing, nonlabored ventilation, respiratory function stable and patient connected to nasal cannula oxygen Cardiovascular status: blood pressure returned to baseline and stable Postop Assessment: no apparent nausea or vomiting Anesthetic complications: no   No notable events documented.   Last Vitals:  Vitals:   11/24/22 1100 11/24/22 1115  BP: (!) 143/62 (!) 146/67  Pulse: (!) 58 60  Resp: 13 11  Temp:    SpO2: 97% 91%    Last Pain:  Vitals:   11/24/22 1115  TempSrc:   PainSc: 0-No pain                 Louie Boston

## 2022-11-24 NOTE — Discharge Instructions (Signed)

## 2022-11-24 NOTE — Anesthesia Preprocedure Evaluation (Signed)
Anesthesia Evaluation  Patient identified by MRN, date of birth, ID band Patient awake    Reviewed: Allergy & Precautions, NPO status , Patient's Chart, lab work & pertinent test results  History of Anesthesia Complications (+) PONV and history of anesthetic complications  Airway Mallampati: III  TM Distance: <3 FB Neck ROM: full    Dental  (+) Chipped   Pulmonary neg shortness of breath, sleep apnea , former smoker   Pulmonary exam normal        Cardiovascular Exercise Tolerance: Good hypertension, (-) angina (-) Past MI Normal cardiovascular exam     Neuro/Psych  PSYCHIATRIC DISORDERS Anxiety Depression     Neuromuscular disease    GI/Hepatic Neg liver ROS,GERD  Controlled,,  Endo/Other  diabetes, Type 2    Renal/GU      Musculoskeletal   Abdominal   Peds  Hematology negative hematology ROS (+)   Anesthesia Other Findings Past Medical History: No date: Angina at rest [facility]) No date: Anxiety     Comment:  panic attacks No date: B12 deficiency anemia No date: Depression No date: Diabetes mellitus without complication (HCC)     Comment:  Type 2 No date: GERD (gastroesophageal reflux disease) 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 No date: Hydrocephalus (HCC)     Comment:  normal pressure No date: Hyperlipidemia No date: Hypertension No date: Infertility management No date: Insulin dependent type 2 diabetes mellitus (HCC) No date: Lumbar disc disorder     Comment:  compression No date: Motion sickness     Comment:  any moving vehicle No date: Neuropathy of both feet     Comment:  Pt attributes to DM No date: NPH (normal pressure hydrocephalus) (HCC) No date: PONV (postoperative nausea and vomiting) No date: Right rotator cuff tear No date: Sleep apnea     Comment:  CPAP No date: SOB (shortness of breath) No date: Tachycardia, unspecified No date: Vertigo     Comment:  daily No date: Vitamin D deficiency  disease  Past Surgical History: 02/01/2006: BRAIN SURGERY     Comment:  Shunt No date: BREAST FIBROADENOMA SURGERY 05/19/2015: CATARACT EXTRACTION W/PHACO; Right     Comment:  Procedure: CATARACT EXTRACTION PHACO AND INTRAOCULAR               LENS PLACEMENT (IOC) right eye;  Surgeon: Sherald Hess, MD;  Location: [facility];  Service:              Ophthalmology;  Laterality: Right;  DIABETIC - oral               meds CPAP LEAVE PT 1ST 06/23/2015: CATARACT EXTRACTION W/PHACO; Left     Comment:  Procedure: CATARACT EXTRACTION PHACO AND INTRAOCULAR               LENS PLACEMENT (IOC) left eye;  Surgeon: Sherald Hess, MD;  Location: [facility];  Service:              Ophthalmology;  Laterality: Left;  DIABETIC - oral meds 1991: CESAREAN SECTION No date: CHOLECYSTECTOMY 05/21/2022: COLONOSCOPY WITH PROPOFOL; N/A     Comment:  Procedure: COLONOSCOPY WITH PROPOFOL;  Surgeon:               Regis Bill, MD;  Location: [facility];  Service: Endoscopy;  Laterality: N/A; No date: HERNIA REPAIR 08/26/2022: HYSTEROSCOPY WITH D & C; N/A     Comment:  Procedure: ATTEMPTED HYSTEROSCOPY, DILATATION AND               CURETTAGE;  Surgeon: Conard Novak, MD;  Location:               [facility] OR[facility];  Service: Gynecology;  Laterality: N/A;                Dilation and Attemtped Hysteroscopy performed;               Polypectomy and Curretage aborted by surgeon No date: TUBAL LIGATION  BMI    Body Mass Index: 33.45 kg/m      Reproductive/Obstetrics negative OB ROS                             Anesthesia Physical Anesthesia Plan  ASA: 3  Anesthesia Plan: General ETT   Post-op Pain Management:    Induction: Intravenous  PONV Risk Score and Plan: Ondansetron, Dexamethasone, Midazolam and Treatment may vary due to age or medical condition  Airway Management Planned: Oral  ETT  Additional Equipment:   Intra-op Plan:   Post-operative Plan: Extubation in OR  Informed Consent: I have reviewed the patients History and Physical, chart, labs and discussed the procedure including the risks, benefits and alternatives for the proposed anesthesia with the patient or authorized representative who has indicated his/her understanding and acceptance.     Dental Advisory Given  Plan Discussed with: Anesthesiologist, CRNA and Surgeon  Anesthesia Plan Comments: (Patient consented for risks of anesthesia including but not limited to:  - adverse reactions to medications - damage to eyes, teeth, lips or other oral mucosa - nerve damage due to positioning  - sore throat or hoarseness - Damage to heart, brain, nerves, lungs, other parts of body or loss of life  Patient voiced understanding and assent.)       Anesthesia Quick Evaluation

## 2022-11-24 NOTE — Transfer of Care (Signed)
Immediate Anesthesia Transfer of Care Note  Patient: Dana Willis  Procedure(s) Performed: XI ROBOTIC ASSISTED TOTAL HYSTERECTOMY WITH BILATERAL SALPINGO OOPHORECTOMY,  Sentinel Lymph Node Injection/ Mapping (Bilateral: Pelvis)  Patient Location: PACU  Anesthesia Type:General  Level of Consciousness: awake and alert   Airway & Oxygen Therapy: Patient Spontanous Breathing and Patient connected to nasal cannula oxygen  Post-op Assessment: Report given to RN and Post -op Vital signs reviewed and stable  Post vital signs: Reviewed and stable  Last Vitals:  Vitals Value Taken Time  BP 134/67 11/24/22 1049  Temp    Pulse 63 11/24/22 1055  Resp 17 11/24/22 1055  SpO2 96 % 11/24/22 1055  Vitals shown include unfiled device data.  Last Pain:  Vitals:   11/24/22 0628  TempSrc: Temporal  PainSc: 0-No pain         Complications: No notable events documented.

## 2022-11-24 NOTE — Interval H&P Note (Signed)
History and Physical Interval Note:  11/24/2022 7:18 AM  Dana Willis  has presented today for surgery, with the diagnosis of PMB, ovarian cyst, endometrial polyp.  The various methods of treatment have been discussed with the patient and family. After consideration of risks, benefits and other options for treatment, the patient has consented to  Procedure(s): XI ROBOTIC ASSISTED TOTAL HYSTERECTOMY WITH BILATERAL SALPINGO OOPHORECTOMY, possible Sentinel Lymph Node Injection/ Mapping/ Removal, possible staging (Bilateral) as a surgical intervention.  The patient's history has been reviewed, patient examined, no change in status, stable for surgery.  I have reviewed the patient's chart and labs.  Questions were answered to the patient's satisfaction.    Thomasene Mohair, MD, [facility] 11/24/2022 7:18 AM

## 2022-11-24 NOTE — Op Note (Addendum)
Operative Note   Date 11/24/2022 TIME 10:38 AM  PRE-OP DIAGNOSIS: Abnormal vaginal bleeding; cervical stenosis precluding intrauterine evaluation, prior h/o surgery.   POST-OP DIAGNOSIS: Abnormal vaginal bleeding; cervical stenosis precluding intrauterine evaluation, prior h/o surgery, pending final pathology  SURGEON: Surgeon(s) and Role: Panel 1: Thomasene Mohair, MD   ASSISTANT:   Yamilett Anastos Leta Jungling, MD  ANESTHESIA: General  PROCEDURE: Procedure(s): Exam under anesthesia, Robotic assisted total hysterectomy and bilateral salpingo-oophorectomy with sentinel node injection and mapping, right peritoneal biopsy, and extensive adhesiolysis involving bladder and adnexa.  ESTIMATED BLOOD LOSS: minimal  DRAINS: Foley  TOTAL IV FLUIDS: see anesthesia note  UOP: see anesthesia note  SPECIMENS:  Uterus, and bilateral fallopian tubes and ovaries, right peritoneal biopsy, and washings.   COMPLICATIONS: None  DISPOSITION: PACU  CONDITION: Stable  INDICATIONS: Abnormal vaginal bleeding; cervical stenosis precluding intrauterine evaluation. She desires definitive surgery.   FINDINGS: Exam under anesthesia revealed an normal 6 week mobile anteverted uterus. There were no adnexal masses or nodularity. The parametria was smooth. The cervix was negative for gross lesions but very stenotic with narrowing of the internal os. Intraoperative findings included: The uterus was normal size and shape. The adnexa were normal bilaterally. The upper abdomen was normal including omentum, bowel, liver, stomach, and diaphragmatic surfaces. The VP shunt was visualized in the upper abdomen. There was a cystic 1.5 cm lesion on the right peritoneum adjacent to the ureter. There was no evidence of grossly enlarged pelvic or right para-aortic lymph nodes. The sentinel nodes mapped to the right external iliac vein (proximal) and left proximal obturator node.   I discussed the pathology uterine specimen with  the pathology team. There was a false passage and the uterine cavity was deviated to the left. A endometrial polyp fell out of the cavity. There was no concerning finding of malignancy or any evidence of invasion. They recommended permanent section rather than frozen evaluation.   PROCEDURE IN DETAIL: After informed consent was obtained, the patient was taken to the operating room where anesthesia was obtained without difficulty. The patient was positioned in the dorsal lithotomy position in [place] stirrups and her arms were carefully tucked at her sides and the usual precautions were taken.  She was prepped and draped in normal sterile fashion.  Time-out was performed. A Foley catheter was placed. Abdominal and vaginal approaches were performed simultaneously. Dr. Jean Rosenthal performed a direct operative entry via a supraumbilical incision and direct entry.   The robotic Hasson port was placed, abdomen insuffulated, and pelvis visualized with noted findings above.  The patient was placed in Trendelenburg and the bowel was displaced up into the upper abdomen.  A speculum was placed in the vagina and bilateral sentinel node injection was performed with 2 cc of ICG on each side. The cervical os was dilated. The uterus sounded to 8 cm.  A small V-Care uterine manipulator was then placed in the uterus without incident under direct visualization.     The remaining gobotic ports and LUQ port placement, and robotic docking were performed. Extensive adhesions were noted and adhesiolysis involving the bladder, uterus, round ligaments was performed > 45 minutes. Round ligaments were divided on each side and the retroperitoneal space was opened bilaterally. The infundibulopelvic ligaments were skeletonized, and the ureters were identified and preserved.  The retroperitoneal spaces were opened and sentinel nodes identified as well as the iliac vessels and obturator nerves. The sentinel nodes were removed and placed in th  paravesical spaces. The right cystic peritoneal lesion  was dissected off the ureter with care. There was no evidence of injury to the ureter.   The bilateral infundibular ligaments were sealed and divided.  A bladder flap was created and the bladder was dissected down off the lower uterine segment and cervix. The uterus was manipulated to allow exposure of the uterine arteries. The uterine arteries were skeletonized bilaterally, sealed and divided with cautery and transected.  A colpotomy was performed circumferentially along the V-Care ring with electrocautery and the cervix was incised from the vagina. The V-care and specimen was removed.  A pneumo balloon was placed in the vagina and ring forceps used to removed both sentinel lymph nodes. The vaginal cuff was then closed in a running continuous fashion using 0 V-Lock suture with careful attention to include the vaginal cuff angles and the vaginal mucosa within the closure.    Hemostasis was observed. The intraperitoneal pressure was dropped, and all planes of dissection, vascular pedicles and the vaginal cuff were found to be hemostatic.  The trocars were removed.   The skin incision at the umbilicus was closed with subcuticular stitch.  The remaining skin incisions were closed with subcuticular stitch and Indermil glue.  The patient tolerated the procedure well.  Sponge, lap and needle counts were correct x2.  The patient was taken to recovery room in excellent condition.  Antibiotics: Given 1st or 2nd generation cephalosporin and Flagyl, Antibiotics given within 1 hour of the start of the procedure, Antibiotics ordered to be discontinued within 24 hours post procedure   VTE prophylaxis: was ordered perioperatively.  Dr. Jean Rosenthal and I assisted eachother with the procedure which could not have been performed without our assistance. This was a high-level case requiring a Retail buyer. Dr. Jean Rosenthal performed the initial abdominal entry, conversion  of initial umbilical port to robotic port, docking, and insertion of robotic instruments while I performed the vaginal part of the procedure and SLN injection. He also provided high level assistance throughout the procedure that was needed for extensive adhesiolysis. He performed the vaginal closure, removal of all the trocars, robotic undocking and skin closure.     Artelia Laroche, MD

## 2022-11-25 ENCOUNTER — Encounter: Payer: Self-pay | Admitting: Obstetrics and Gynecology

## 2022-11-25 LAB — SURGICAL PATHOLOGY

## 2022-11-26 LAB — CYTOLOGY - NON PAP

## 2023-09-10 IMAGING — CT CT HEAD W/O CM
2 series · 14 of 30 positions shown, 16 images · non-contrast
Comparison: 09/22/2015

CLINICAL DATA: Normal pressure hydrocephalus, status post VP shunt,
unsteady gait and dizziness



[Series 2: head wo · axial · 0.45mm/px · z∈[-54,+46]mm · 6 of 30 slices shown, 8 images]
[im 5/30  brain]
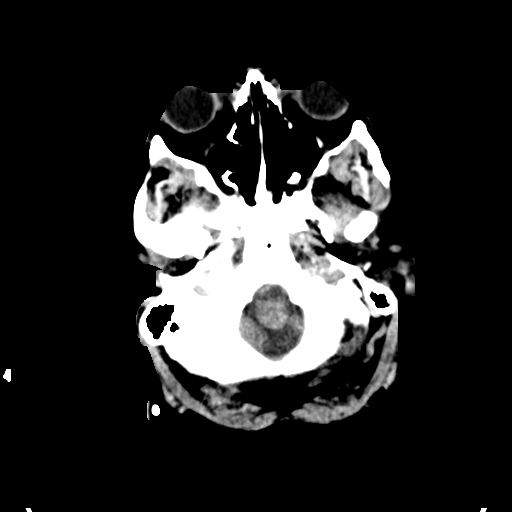
[im 5/30  bone]
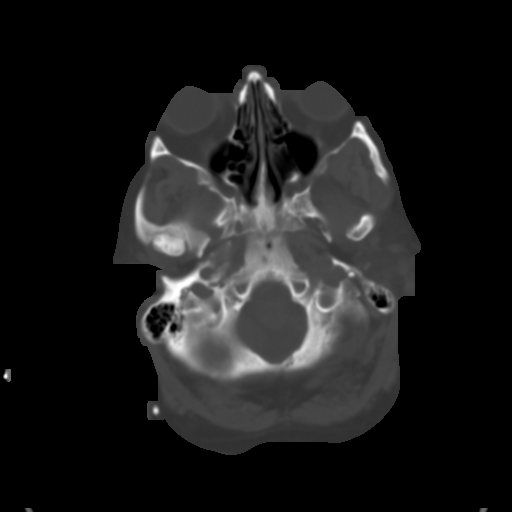
[im 9/30  brain]
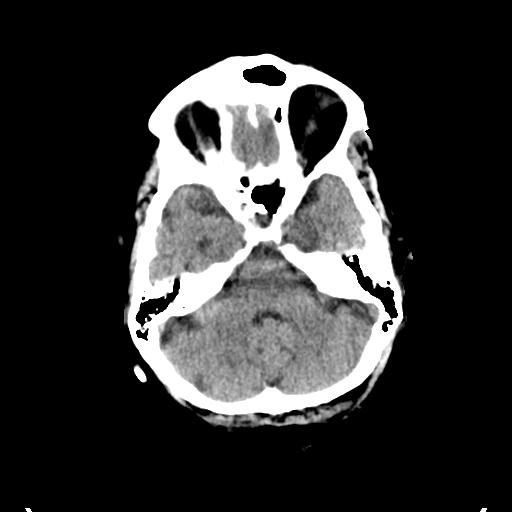
[im 13/30  brain]
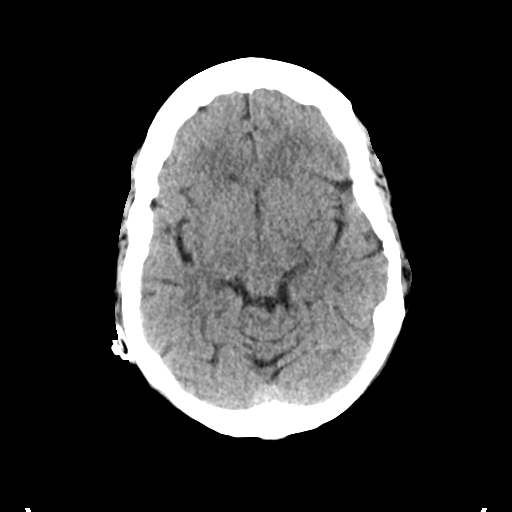
[im 17/30  brain]
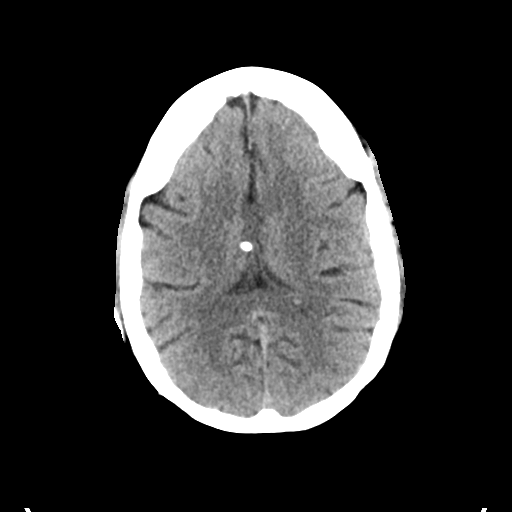
[im 21/30  brain]
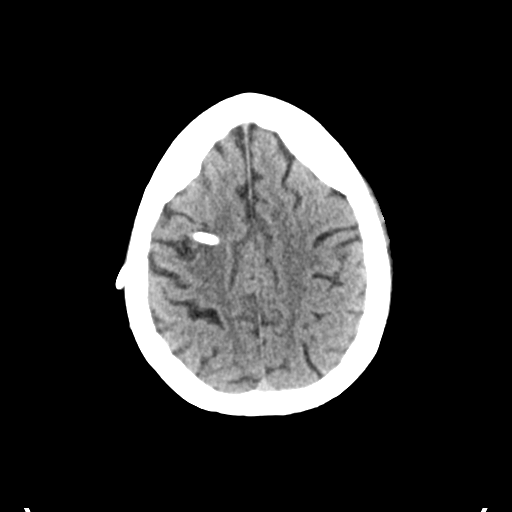
[im 21/30  bone]
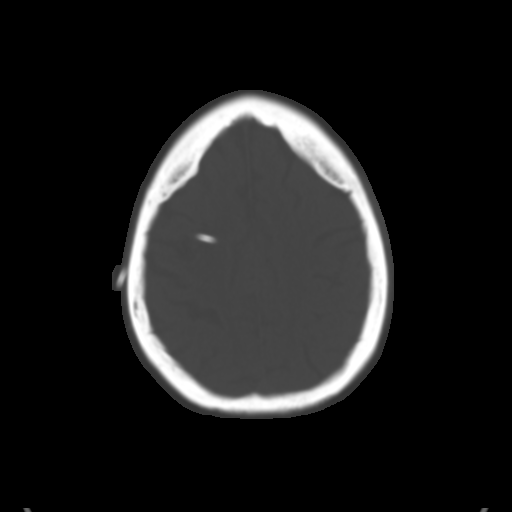
[im 25/30  brain]
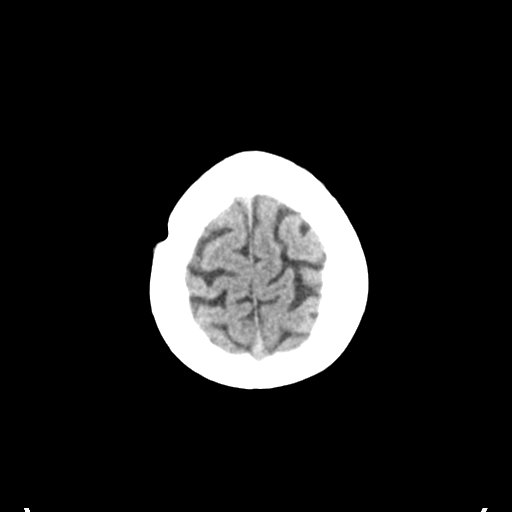

[Series 3: head bone · axial · 0.45mm/px · z∈[-60,+62]mm · 8 of 77 slices shown]
[im 8/77  bone]
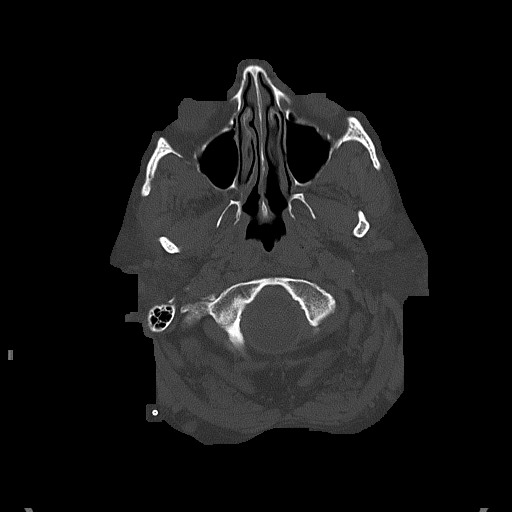
[im 15/77  bone]
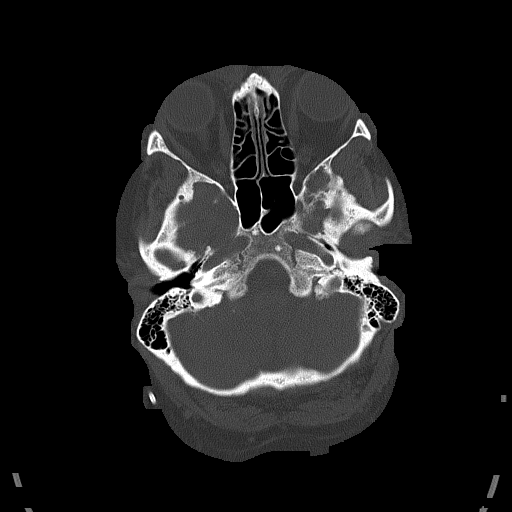
[im 26/77  bone]
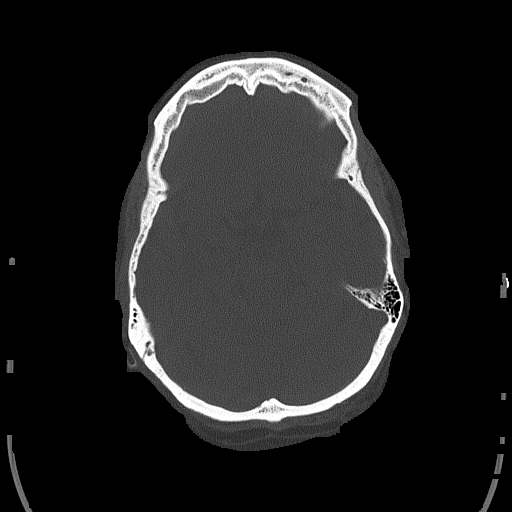
[im 33/77  bone]
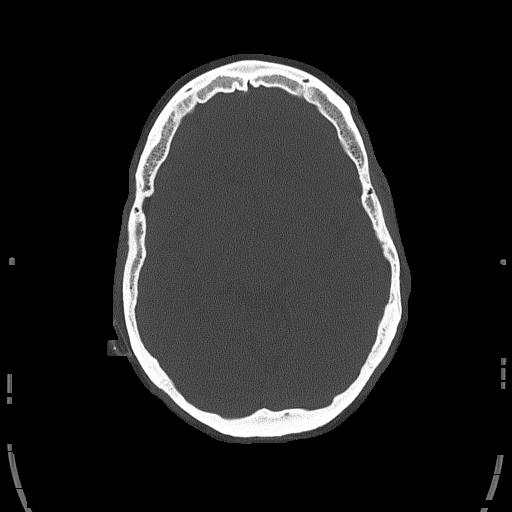
[im 44/77  bone]
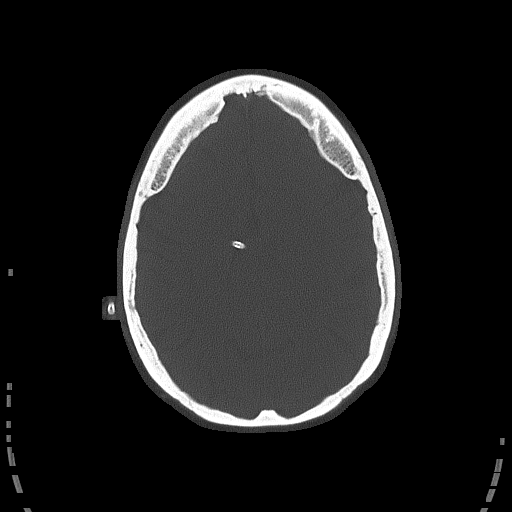
[im 51/77  bone]
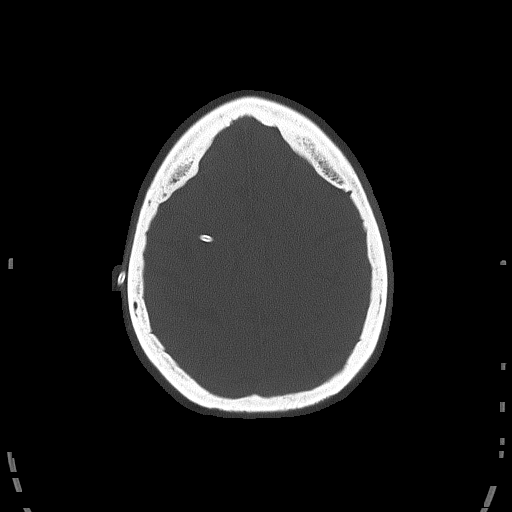
[im 62/77  bone]
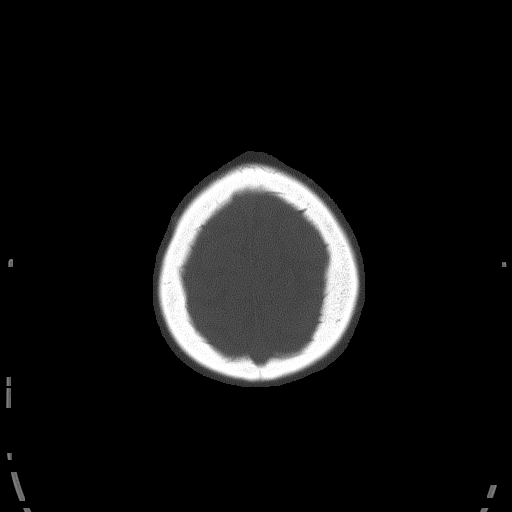
[im 69/77  bone]
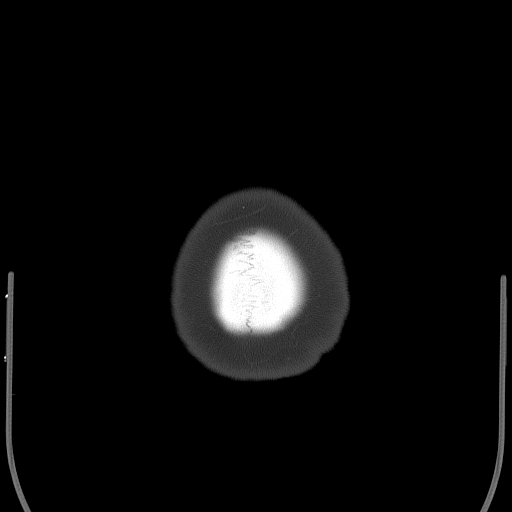

[14 of 30 positions shown; findings below may reference images not displayed]

FINDINGS: Brain: Redemonstrated right frontal approach ventriculostomy shunt,
with tip in the right lateral ventricle. Unchanged size and
configuration of the ventricular system. No discontinuity in the
imaged shunt tubing.

No acute infarct, hemorrhage, mass mass effect, midline shift. No
extra-axial collection.

Vascular: No hyperdense vessel.

Skull: Right frontal burr hole. Negative for fracture or focal
lesion.

Sinuses/Orbits: Clear paranasal sinuses. Status post bilateral lens
replacements.

Other: The mastoids are well aerated.
IMPRESSION: 1. Unchanged position of right frontal approach ventriculostomy
catheter in unchanged size and configuration of the ventricular
system.
2.  No acute intracranial process.

## 2023-11-25 NOTE — Progress Notes (Unsigned)
 [facility] [address] [place], [place] [address]  Pulmonary Sleep Medicine   Office Visit Note  Patient Name: Dana Willis DOB: 10/21/1955 MRN [id number]    Chief Complaint: Obstructive Sleep Apnea visit  Brief History:  Dana Willis is seen today for an annual follow up visit for APAP@ 6-15 cmH2O. The patient has a 13+ year history of sleep apnea. Patient is using PAP nightly.  The patient feels rested after sleeping with PAP.  The patient reports benefiting from PAP use. Reported sleepiness is  improved and the Epworth Sleepiness Score is 3 out of 24. The patient does not take naps. The patient complains of the following: none.  The compliance download shows 100% compliance with an average use time of 7 hours 40  minutes. The AHI is 0.6.  The patient does not complain of limb movements disrupting sleep. The patient continues to require PAP therapy in order to eliminate sleep apnea.   ROS  General: (-) fever, (-) chills, (-) night sweat Nose and Sinuses: (-) nasal stuffiness or itchiness, (-) postnasal drip, (-) nosebleeds, (-) sinus trouble. Mouth and Throat: (-) sore throat, (-) hoarseness. Neck: (-) swollen glands, (-) enlarged thyroid, (-) neck pain. Respiratory: - cough, - shortness of breath, - wheezing. Neurologic: + numbness, + tingling takes pregabalin for this.  Psychiatric: + anxiety, + depression   Current Medication: Outpatient Encounter Medications as of 11/28/2023  Medication Sig Note   acyclovir (ZOVIRAX) 400 MG tablet Take 400 mg by mouth 2 (two) times daily.    ALPRAZolam (XANAX) 0.25 MG tablet Take 0.25 mg by mouth as needed for anxiety.    Ascorbic Acid (VITAMIN C) 1000 MG tablet Take 1,000 mg by mouth daily.    aspirin 81 MG tablet Take 81 mg by mouth daily.    Calcium Carbonate (CALCIUM 600 PO) Take by mouth 2 (two) times daily.    Cholecalciferol 25 MCG (1000 UT) tablet Take by mouth.    dapagliflozin propanediol (FARXIGA) 10 MG TABS tablet  Take 1 tablet by mouth every morning.    doxazosin (CARDURA) 4 MG tablet Take 4 mg by mouth every morning.    ELDERBERRY PO Take 550 mg by mouth 2 (two) times daily.    hydrochlorothiazide (HYDRODIURIL) 25 MG tablet Take 25 mg by mouth daily.    ibuprofen  (ADVIL ) 600 MG tablet Take 1 tablet (600 mg total) by mouth every 6 (six) hours.    lisinopril (ZESTRIL) 20 MG tablet Take 20 mg by mouth daily.    metFORMIN (GLUCOPHAGE) 1000 MG tablet Take 1 tablet by mouth daily.    metoprolol (LOPRESSOR) 50 MG tablet Take 50 mg by mouth daily.    Multiple Vitamins-Minerals (MULTIVITAMIN ADULT PO) Take by mouth daily.    omega-3 acid ethyl esters (LOVAZA) 1 g capsule Take by mouth 2 (two) times daily.    ondansetron  (ZOFRAN -ODT) 4 MG disintegrating tablet Take 1 tablet (4 mg total) by mouth every 6 (six) hours as needed for nausea.    oxyCODONE -acetaminophen  (PERCOCET) 5-325 MG tablet Take 1 tablet by mouth every 6 (six) hours as needed (breakthrough pain).    OZEMPIC, 2 MG/DOSE, 8 MG/3ML SOPN Inject into the skin.    pantoprazole (PROTONIX) 40 MG tablet Take 40 mg by mouth daily after supper.    PARoxetine (PAXIL) 10 MG tablet Take 10 mg by mouth at bedtime.    potassium chloride  SA (KLOR-CON  M) 20 MEQ tablet Take 1 tablet (20 mEq total) by mouth daily. Be sure to  take dose on day of procedure. Follow up with PCP for repeat labs.    pregabalin (LYRICA) 200 MG capsule Take 200 mg by mouth 2 (two) times daily.    Probiotic, Lactobacillus, CAPS Take by mouth.    rosuvastatin (CRESTOR) 20 MG tablet Take 20 mg by mouth daily.    TRESIBA FLEXTOUCH 200 UNIT/ML FlexTouch Pen Inject 90 Units into the skin at bedtime. 11/24/2022: 46 units 11/23/22   No facility-administered encounter medications on file as of 11/28/2023.    Surgical History: Past Surgical History:  Procedure Laterality Date   BRAIN SURGERY  02/01/2006   Shunt   BREAST FIBROADENOMA SURGERY     CATARACT EXTRACTION W/PHACO Right 05/19/2015    Procedure: CATARACT EXTRACTION PHACO AND INTRAOCULAR LENS PLACEMENT (IOC) right eye;  Surgeon: Donzell Arlyce Budd, MD;  Location: [facility];  Service: Ophthalmology;  Laterality: Right;  DIABETIC - oral meds CPAP LEAVE PT 1ST   CATARACT EXTRACTION W/PHACO Left 06/23/2015   Procedure: CATARACT EXTRACTION PHACO AND INTRAOCULAR LENS PLACEMENT (IOC) left eye;  Surgeon: Donzell Arlyce Budd, MD;  Location: [facility];  Service: Ophthalmology;  Laterality: Left;  DIABETIC - oral meds   CESAREAN SECTION  1991   CHOLECYSTECTOMY     COLONOSCOPY WITH PROPOFOL  N/A 05/21/2022   Procedure: COLONOSCOPY WITH PROPOFOL ;  Surgeon: Maryruth Ole DASEN, MD;  Location: [facility];  Service: Endoscopy;  Laterality: N/A;   HERNIA REPAIR     HYSTEROSCOPY WITH D & C N/A 08/26/2022   Procedure: ATTEMPTED HYSTEROSCOPY, DILATATION AND CURETTAGE;  Surgeon: Leonce Garnette BIRCH, MD;  Location: [facility];  Service: Gynecology;  Laterality: N/A;  Dilation and Attemtped Hysteroscopy performed; Polypectomy and Curretage aborted by surgeon   ROBOTIC ASSISTED TOTAL HYSTERECTOMY WITH BILATERAL SALPINGO OOPHERECTOMY Bilateral 11/24/2022   Procedure: XI ROBOTIC ASSISTED TOTAL HYSTERECTOMY WITH BILATERAL SALPINGO OOPHORECTOMY,  Sentinel Lymph Node Injection/ Mapping;  Surgeon: Leonce Garnette BIRCH, MD;  Location: [facility];  Service: Gynecology;  Laterality: Bilateral;   TUBAL LIGATION      Medical History: Past Medical History:  Diagnosis Date   Angina at rest    Anxiety    panic attacks   B12 deficiency anemia    Depression    Diabetes mellitus without complication (HCC)    Type 2   GERD (gastroesophageal reflux disease)    HSV-2 infection    Hydrocephalus (HCC)    normal pressure   Hyperlipidemia    Hypertension    Infertility management    Insulin dependent type 2 diabetes mellitus (HCC)    Lumbar disc disorder    compression   Motion sickness    any moving vehicle   Neuropathy of both  feet    Pt attributes to DM   NPH (normal pressure hydrocephalus) (HCC)    PONV (postoperative nausea and vomiting)    Right rotator cuff tear    Sleep apnea    CPAP   SOB (shortness of breath)    Tachycardia, unspecified    Vertigo    daily   Vitamin D deficiency disease     Family History: Non contributory to the present illness  Social History: Social History   Socioeconomic History   Marital status: Married    Spouse name: Lemond   Number of children: 1   Years of education: Not on file   Highest education level: Not on file  Occupational History   Not on file  Tobacco Use   Smoking status: Former    Current packs/day:  0.00    Types: Cigarettes    Quit date: 02/02/1983    Years since quitting: 40.8   Smokeless tobacco: Never  Vaping Use   Vaping status: Never Used  Substance and Sexual Activity   Alcohol use: Yes    Alcohol/week: 1.0 standard drink of alcohol    Types: 1 Glasses of wine per week   Drug use: Never   Sexual activity: Not on file  Other Topics Concern   Not on file  Social History Narrative   Not on file   Social Drivers of Health   Financial Resource Strain: Low Risk  (06/06/2023)   Received from [facility]   Overall Financial Resource Strain (CARDIA)    Difficulty of Paying Living Expenses: Not hard at all  Food Insecurity: No Food Insecurity (06/06/2023)   Received from [facility]   Hunger Vital Sign    Within the past 12 months, you worried that your food would run out before you got the money to buy more.: Never true    Within the past 12 months, the food you bought just didn't last and you didn't have money to get more.: Never true  Transportation Needs: No Transportation Needs (06/06/2023)   Received from [facility] - Transportation    In the past 12 months, has lack of transportation kept you from medical appointments or from getting medications?: No    Lack of  Transportation (Non-Medical): No  Physical Activity: Not on file  Stress: Not on file  Social Connections: Not on file  Intimate Partner Violence: Not on file    Vital Signs: Blood pressure 137/68, pulse (!) 57, resp. rate 16, height 5' 1 (1.549 m), weight 184 lb (83.5 kg), SpO2 96%. Body mass index is 34.77 kg/m.    Examination: General Appearance: The patient is well-developed, well-nourished, and in no distress. Neck Circumference: 39.5 cm Skin: Gross inspection of skin unremarkable. Head: normocephalic, no gross deformities. Eyes: no gross deformities noted. ENT: ears appear grossly normal Neurologic: Alert and oriented. No involuntary movements.  STOP BANG RISK ASSESSMENT S (snore) Have you been told that you snore?     NO   T (tired) Are you often tired, fatigued, or sleepy during the day?   NO  O (obstruction) Do you stop breathing, choke, or gasp during sleep? NO   P (pressure) Do you have or are you being treated for high blood pressure? YES   B (BMI) Is your body index greater than 35 kg/m? YES   A (age) Are you 72 years old or older? YES   N (neck) Do you have a neck circumference greater than 16 inches?   YES   G (gender) Are you a female? NO   TOTAL STOP/BANG "YES" ANSWERS 4       A STOP-Bang score of 2 or less is considered low risk, and a score of 5 or more is high risk for having either moderate or severe OSA. For people who score 3 or 4, doctors may need to perform further assessment to determine how likely they are to have OSA.         EPWORTH SLEEPINESS SCALE:  Scale:  (0)= no chance of dozing; (1)= slight chance of dozing; (2)= moderate chance of dozing; (3)= high chance of dozing  Chance  Situtation    Sitting and reading: 1    Watching TV: 1    Sitting Inactive in public: 0  As a passenger in car: 0      Lying down to rest: 1    Sitting and talking: 0    Sitting quielty after lunch: 0    In a car, stopped in traffic:  0   TOTAL SCORE:   3 out of 24    SLEEP STUDIES:  Titration (03/2017) APAP@ 6-15 cmH2O PSG (08/2020) AHI 80.9/hr, min SpO2 73% Titration (08/2020) CPAP@ 14 cmH2O   CPAP COMPLIANCE DATA:  Date Range: 11/28/2022-11/27/2023  Average Daily Use: 7 hours 40 minutes  Median Use: 7 hours 49 minutes  Compliance for > 4 Hours: 100%  AHI: 0.6 respiratory events per hour  Days Used: 365/365 days  Mask Leak: 31  95th Percentile Pressure: 13.8         LABS: No results found for this or any previous visit (from the past 2160 hours).  Radiology: No results found.  No results found.  No results found.    Assessment and Plan: Patient Active Problem List   Diagnosis Date Noted   Pelvic adhesive disease 11/24/2022   Postmenopausal bleeding 08/26/2022   Thickened endometrium 08/26/2022   Endometrial polyp 08/26/2022   OSA on CPAP 07/21/2020   CPAP use counseling 07/21/2020   Obesity (BMI 30-39.9) 07/21/2020   Type 2 diabetes mellitus with obesity 03/08/2018   DM type 2 with diabetic peripheral neuropathy (HCC) 03/08/2018   Hypertriglyceridemia 03/08/2018   Long-term insulin use (HCC) 03/08/2018   Essential hypertension 03/03/2016   Gastroesophageal reflux disease without esophagitis 03/03/2016   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 OSA on CPAP (Primary) The patient does tolerate PAP and reports  benefit from PAP use. The patient was reminded how to clean equipment and advised to replace supplies routinely. The patient was also counselled on weight loss. The compliance is excellent. The AHI is 0.6.   OSA on cpap- controlled. Continue with excellent compliance with pap. CPAP continues to be medically necessary to treat this patient's OSA. F/u one year.    2. CPAP use counseling CPAP Counseling: had a lengthy discussion with the patient regarding the importance of PAP therapy  in management of the sleep apnea. Patient appears to understand the risk factor reduction and also understands the risks associated with untreated sleep apnea. Patient will try to make a good faith effort to remain compliant with therapy. Also instructed the patient on proper cleaning of the device including the water must be changed daily if possible and use of distilled water is preferred. Patient understands that the machine should be regularly cleaned with appropriate recommended cleaning solutions that do not damage the PAP machine for example given white vinegar and water rinses. Other methods such as ozone treatment may not be as good as these simple methods to achieve cleaning.   3. Essential hypertension Controlled with doxazosin, hydrochlorothiazide, lisinopril, metoprolol. Continue.      General Counseling: I have discussed the findings of the evaluation and examination with [facility].  I have also discussed any further diagnostic evaluation thatmay be needed or ordered today. Dana Willis verbalizes understanding of the findings of todays visit. We also reviewed her medications today and discussed drug interactions and side effects including but not limited excessive drowsiness and altered mental states. We also discussed that there is always a risk not just to her but also people around her. she has been encouraged to call the office with any questions or concerns that should arise  related to todays visit.  No orders of the defined types were placed in this encounter.       I have personally obtained a history, examined the patient, evaluated laboratory and imaging results, formulated the assessment and plan and placed orders. This patient was seen today by Lauraine Lay, PA-C in collaboration with Dr. Elfreda Bathe.   Elfreda DELENA Bathe, MD [facility] Diplomate ABMS Pulmonary Critical Care Medicine and Sleep Medicine

## 2023-11-28 ENCOUNTER — Ambulatory Visit (INDEPENDENT_AMBULATORY_CARE_PROVIDER_SITE_OTHER): Admitting: Internal Medicine

## 2023-11-28 VITALS — BP 137/68 | HR 57 | Resp 16 | Ht 61.0 in | Wt 184.0 lb

## 2023-11-28 DIAGNOSIS — Z7189 Other specified counseling: Secondary | ICD-10-CM | POA: Diagnosis not present

## 2023-11-28 DIAGNOSIS — I10 Essential (primary) hypertension: Secondary | ICD-10-CM

## 2023-11-28 DIAGNOSIS — G4733 Obstructive sleep apnea (adult) (pediatric): Secondary | ICD-10-CM

## 2023-11-28 NOTE — Patient Instructions (Signed)
